# Patient Record
Sex: Male | Born: 1990 | Race: Black or African American | Hispanic: No | Marital: Single | Smoking: Current some day smoker
Health system: Southern US, Community
[De-identification: ages and names within clinical notes are randomized; demographics above are authoritative.]

## PROBLEM LIST (undated history)

## (undated) DIAGNOSIS — R569 Unspecified convulsions: Secondary | ICD-10-CM

---
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
## 2012-09-13 ENCOUNTER — Emergency Department: Payer: Self-pay | Admitting: Emergency Medicine

## 2012-09-13 LAB — URINALYSIS, COMPLETE
Bilirubin,UR: NEGATIVE
Blood: NEGATIVE
Glucose,UR: NEGATIVE mg/dL (ref 0–75)
Leukocyte Esterase: NEGATIVE
Nitrite: NEGATIVE
Protein: NEGATIVE
RBC,UR: 3 /HPF (ref 0–5)

## 2012-09-13 LAB — GC/CHLAMYDIA PROBE AMP

## 2012-09-20 ENCOUNTER — Emergency Department: Payer: Self-pay | Admitting: Emergency Medicine

## 2012-09-20 LAB — URINALYSIS, COMPLETE
Bilirubin,UR: NEGATIVE
Blood: NEGATIVE
Glucose,UR: NEGATIVE mg/dL (ref 0–75)
Ketone: NEGATIVE
Leukocyte Esterase: NEGATIVE
Ph: 7 (ref 4.5–8.0)
RBC,UR: <1 /HPF (ref 0–5)
Specific Gravity: 1.017 (ref 1.003–1.030)
Squamous Epithelial: <1
WBC UR: <1 /HPF (ref 0–5)

## 2018-10-11 ENCOUNTER — Encounter: Payer: Self-pay | Admitting: Emergency Medicine

## 2018-10-11 ENCOUNTER — Emergency Department
Admission: EM | Admit: 2018-10-11 | Discharge: 2018-10-12 | Disposition: A | Discharge status: Home / Self Care | Payer: BC Managed Care – PPO | Attending: Emergency Medicine | Admitting: Emergency Medicine

## 2018-10-11 DIAGNOSIS — F1210 Cannabis abuse, uncomplicated: Secondary | ICD-10-CM | POA: Insufficient documentation

## 2018-10-11 DIAGNOSIS — F17210 Nicotine dependence, cigarettes, uncomplicated: Secondary | ICD-10-CM | POA: Diagnosis not present

## 2018-10-11 DIAGNOSIS — R569 Unspecified convulsions: Secondary | ICD-10-CM | POA: Insufficient documentation

## 2018-10-11 HISTORY — DX: Unspecified convulsions: R56.9

## 2018-10-11 LAB — COMPREHENSIVE METABOLIC PANEL
ALT: 36 U/L (ref 0–44)
AST: 25 U/L (ref 15–41)
Albumin: 4.4 g/dL (ref 3.5–5.0)
Alkaline Phosphatase: 51 U/L (ref 38–126)
Anion gap: 10 (ref 5–15)
BUN: 14 mg/dL (ref 6–20)
CO2: 22 mmol/L (ref 22–32)
Calcium: 9.6 mg/dL (ref 8.9–10.3)
Chloride: 106 mmol/L (ref 98–111)
Creatinine, Ser: 0.89 mg/dL (ref 0.61–1.24)
GFR calc Af Amer: >60 mL/min (ref >60)
GFR calc non Af Amer: >60 mL/min (ref >60)
Glucose, Bld: 89 mg/dL (ref 70–99)
Potassium: 3.8 mmol/L (ref 3.5–5.1)
Sodium: 138 mmol/L (ref 135–145)
Total Bilirubin: 0.8 mg/dL (ref 0.3–1.2)
Total Protein: 7.3 g/dL (ref 6.5–8.1)

## 2018-10-11 LAB — CBC
HCT: 41.4 % (ref 39.0–52.0)
Hemoglobin: 13.5 g/dL (ref 13.0–17.0)
MCH: 25.3 pg — ABNORMAL LOW (ref 26.0–34.0)
MCHC: 32.6 g/dL (ref 30.0–36.0)
MCV: 77.5 fL — ABNORMAL LOW (ref 80.0–100.0)
Platelets: 245 10*3/uL (ref 150–400)
RBC: 5.34 MIL/uL (ref 4.22–5.81)
RDW: 13.4 % (ref 11.5–15.5)
WBC: 14.4 10*3/uL — ABNORMAL HIGH (ref 4.0–10.5)
nRBC: 0 % (ref 0.0–0.2)

## 2018-10-11 MED ORDER — LEVETIRACETAM IN NACL 1000 MG/100ML IV SOLN
1000.0000 mg | Freq: Once | INTRAVENOUS | Status: AC
  Administered 2018-10-11: 1000 mg via INTRAVENOUS
  Filled 2018-10-11: qty 100

## 2018-10-11 MED ORDER — LEVETIRACETAM 500 MG PO TABS: 500.0000 mg | ORAL_TABLET | Freq: Two times a day (BID) | ORAL | 2 refills | Status: DC

## 2018-10-11 NOTE — ED Notes (Signed)
Pt did not urinating on himself/no sings pt biting his tongue.

## 2018-10-11 NOTE — ED Notes (Signed)
ED Provider Paduchowski at bedside. 

## 2018-10-11 NOTE — ED Provider Notes (Signed)
[facility] Emergency Department Provider Note  Time seen: 10:15 PM  I have reviewed the triage vital signs and the nursing notes.   HISTORY  Chief Complaint Seizures  HPI Jose Cowan is a 28 y.o. male with a past medical history of 1 prior seizure presents to the emergency department for a likely seizure.  According to EMS report patient's girlfriend who is with the patient states he fell to the ground and had a seizure lasting approximately 1 minute.  Describes shaking activity and postictal period following the seizure activity.  Patient states in March she had a seizure for the first time, was worked up at [facility] with a negative work-up and ultimately discharged home.  Patient does admit to alcohol, but states only approximately 1 beer per day states he was drinking heavily previously but cut back over 8 months ago.  Does admit to marijuana use but denies any other substances.  Patient has not been followed up by a neurologist or PCP regarding the seizure.   Past Medical History:  Diagnosis Date  . Seizures (HCC)     There are no active problems to display for this patient.   History reviewed. No pertinent surgical history.  Prior to Admission medications   Not on File    Not on File  History reviewed. No pertinent family history.  Social History Social History   Tobacco Use  . Smoking status: Current Every Day Smoker    Packs/day: 1.00    Years: 10.00    Pack years: 10.00    Types: Cigarettes  . Smokeless tobacco: Never Used  Substance Use Topics  . Alcohol use: Yes  . Drug use: Yes    Frequency: 3.0 times per week    Types: Marijuana    Review of Systems Constitutional: Negative for fever. ENT: Negative for recent illness/congestion Cardiovascular: Negative for chest pain (contrary to triage note) Respiratory: Negative for shortness of breath.  Negative for cough. Gastrointestinal: Negative for abdominal pain, vomiting Genitourinary:  Negative for urinary compaints Musculoskeletal: Negative for musculoskeletal complaints Skin: Negative for skin complaints Neurological: Negative for headache All other ROS negative  ____________________________________________   PHYSICAL EXAM:  VITAL SIGNS: ED Triage Vitals  Enc Vitals Group     BP 10/11/18 2121 (!) 146/94     Pulse Rate 10/11/18 2119 85     Resp 10/11/18 2121 19     Temp 10/11/18 2119 98.5 F (36.9 C)     Temp Source 10/11/18 2119 Oral     SpO2 10/11/18 2119 97 %     Weight 10/11/18 2120 240 lb (108.9 kg)     Height 10/11/18 2120 6\' 2"  (1.88 m)     Head Circumference --      Peak Flow --      Pain Score 10/11/18 2120 0     Pain Loc --      Pain Edu? --      Excl. in GC? --    Constitutional: Alert and oriented. Well appearing and in no distress. Eyes: Normal exam ENT      Head: Normocephalic and atraumatic.      Mouth/Throat: Mucous membranes are moist. Cardiovascular: Normal rate, regular rhythm.  Respiratory: Normal respiratory effort without tachypnea nor retractions. Breath sounds are clear Gastrointestinal: Soft and nontender. No distention.   Musculoskeletal: Nontender with normal range of motion in all extremities. Neurologic:  Normal speech and language. No gross focal neurologic deficits  Skin:  Skin is warm, dry and  intact.  Psychiatric: Mood and affect are normal  ____________________________________________    EKG  EKG viewed and interpreted by myself shows a normal sinus rhythm at 85 bpm with a narrow QRS, normal axis, normal intervals, no concerning ST changes.  ____________________________________________   INITIAL IMPRESSION / ASSESSMENT AND PLAN / ED COURSE  Pertinent labs & imaging results that were available during my care of the patient were reviewed by me and considered in my medical decision making (see chart for details).   Patient presents to the emergency department after a possible seizure tonight approximately 1  minute of generalized tonic-clonic reported activity.  Patient had a seizure back in March as well.  I reviewed the patient's [facility] work-up at that time he had a negative CT scan as well as lab work showing no acute abnormalities.  Patient was ultimately discharged home but never followed up.  We will check lab work, urinalysis, loaded with IV Keppra.  Long the patient's work-up is normal the patient continues to appear well anticipate likely discharge home with Keppra and neurology follow-up.  Blood work is largely normal slight white blood cell count elevation could be explained by the seizure.  Patient is receiving his IV Keppra.  Urinalysis pending.  Patient care signed out to oncoming physician plan to discharge home with Keppra.  Jose Cowan was evaluated in Emergency Department on 10/11/2018 for the symptoms described in the history of present illness. He was evaluated in the context of the global COVID-19 pandemic, which necessitated consideration that the patient might be at risk for infection with the SARS-CoV-2 virus that causes COVID-19. Institutional protocols and algorithms that pertain to the evaluation of patients at risk for COVID-19 are in a state of rapid change based on information released by regulatory bodies including the CDC and federal and state organizations. These policies and algorithms were followed during the patient's care in the ED.  ____________________________________________   FINAL CLINICAL IMPRESSION(S) / ED DIAGNOSES  Seizure   Harvest Dark, MD 10/11/18 2322

## 2018-10-11 NOTE — Discharge Instructions (Signed)
Please take your medication as prescribed every day in.  Please do not drive until you have been cleared by neurology.  Please call the number provided for neurology to arrange a follow-up appointment as soon as possible.

## 2018-10-11 NOTE — ED Notes (Signed)
Report from yessica, rn.  

## 2018-10-11 NOTE — ED Triage Notes (Addendum)
Pt from home via [facility]. Per EMS, significant other reported pt falling on the hallway at home and witnessed a seizure that lasted for 43min. Pt unable to recall falling. Pt reports mid CP; SHOB at home, pt unable to recall falling n. Pt presents to ED with tremors.  NAD noted upon arrival.

## 2018-10-11 NOTE — ED Notes (Addendum)
Pt denies CP/SHOB/Dizziness/N/V at this time. Pt denies taking medications for seizures.

## 2018-10-12 NOTE — ED Provider Notes (Signed)
Patient appears alert and oriented at this time.  Will be discharged home with Keppra.   Earleen Newport, MD 10/12/18 [phone]

## 2018-10-12 NOTE — ED Notes (Signed)
Waiting on ride.

## 2019-12-17 ENCOUNTER — Emergency Department: Payer: BC Managed Care – PPO

## 2019-12-17 ENCOUNTER — Emergency Department
Admission: EM | Admit: 2019-12-17 | Discharge: 2019-12-17 | Disposition: A | Discharge status: Home / Self Care | Payer: BC Managed Care – PPO | Attending: Emergency Medicine | Admitting: Emergency Medicine

## 2019-12-17 ENCOUNTER — Other Ambulatory Visit: Payer: Self-pay

## 2019-12-17 DIAGNOSIS — Y92410 Unspecified street and highway as the place of occurrence of the external cause: Secondary | ICD-10-CM | POA: Diagnosis not present

## 2019-12-17 DIAGNOSIS — M5459 Other low back pain: Secondary | ICD-10-CM | POA: Insufficient documentation

## 2019-12-17 DIAGNOSIS — Y93I9 Activity, other involving external motion: Secondary | ICD-10-CM | POA: Diagnosis not present

## 2019-12-17 DIAGNOSIS — Z79899 Other long term (current) drug therapy: Secondary | ICD-10-CM | POA: Diagnosis not present

## 2019-12-17 DIAGNOSIS — M436 Torticollis: Secondary | ICD-10-CM | POA: Diagnosis not present

## 2019-12-17 DIAGNOSIS — F17210 Nicotine dependence, cigarettes, uncomplicated: Secondary | ICD-10-CM | POA: Diagnosis not present

## 2019-12-17 MED ORDER — MELOXICAM 15 MG PO TABS: 15.0000 mg | ORAL_TABLET | Freq: Every day | ORAL | 1 refills | Status: AC

## 2019-12-17 MED ORDER — METHOCARBAMOL 500 MG PO TABS: 500.0000 mg | ORAL_TABLET | Freq: Three times a day (TID) | ORAL | 0 refills | Status: AC | PRN
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
## 2019-12-17 NOTE — Discharge Instructions (Signed)
You can take meloxicam and Robaxin for pain and inflammation. 
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
## 2022-01-06 ENCOUNTER — Ambulatory Visit
Admission: RE | Admit: 2022-01-06 | Discharge: 2022-01-06 | Disposition: A | Discharge status: Home / Self Care | Payer: BC Managed Care – PPO | Source: Ambulatory Visit | Attending: Physician Assistant | Admitting: Physician Assistant

## 2022-01-06 VITALS — BP 124/100 | HR 80 | Temp 98.1°F | Resp 18 | Ht 75.0 in | Wt 253.0 lb

## 2022-01-06 DIAGNOSIS — Z202 Contact with and (suspected) exposure to infections with a predominantly sexual mode of transmission: Secondary | ICD-10-CM | POA: Diagnosis present

## 2022-01-06 DIAGNOSIS — R300 Dysuria: Secondary | ICD-10-CM

## 2022-01-06 LAB — URINALYSIS, ROUTINE W REFLEX MICROSCOPIC
Bilirubin Urine: NEGATIVE
Glucose, UA: NEGATIVE mg/dL
Hgb urine dipstick: NEGATIVE
Ketones, ur: NEGATIVE mg/dL
Leukocytes,Ua: NEGATIVE
Nitrite: NEGATIVE
Protein, ur: NEGATIVE mg/dL
Specific Gravity, Urine: 1.02 (ref 1.005–1.030)
pH: 6 (ref 5.0–8.0)

## 2022-01-06 MED ORDER — DOXYCYCLINE HYCLATE 100 MG PO CAPS: 100.0000 mg | ORAL_CAPSULE | Freq: Two times a day (BID) | ORAL | 0 refills | Status: AC

## 2022-01-06 NOTE — ED Provider Notes (Signed)
[facility]    CSN: [id number] Arrival date & time: 01/06/22  4401      History   Chief Complaint Chief Complaint  Patient presents with   Exposure to STD    HPI Jose Cowan is a 31 y.o. male presenting for possible STI.  Patient reports being sexually active with females a couple of weeks ago.  He says that she called him and told him that he needs to be tested for chlamydia because she has chlamydia.  He reports that in the past 3 days he has had some lower abdominal cramping, constipation, urinary urgency and penile discharge.  He says that he has had chlamydia in the past and these are symptoms like he had. He is afraid that he may have chlamydia.  HPI  Past Medical History:  Diagnosis Date   Seizures (HCC)     There are no problems to display for this patient.   History reviewed. No pertinent surgical history.     Home Medications    Prior to Admission medications   Medication Sig Start Date End Date Taking? Authorizing Provider  doxycycline (VIBRAMYCIN) 100 MG capsule Take 1 capsule (100 mg total) by mouth 2 (two) times daily for 7 days. 01/06/22 01/13/22 Yes Shirlee Latch, PA-C  levETIRAcetam (KEPPRA) 500 MG tablet Take 1 tablet (500 mg total) by mouth 2 (two) times daily. 10/11/18   Minna Antis, MD  valACYclovir (VALTREX) 500 MG tablet Take 500 mg by mouth daily. 07/08/18   [provider]    Family History History reviewed. No pertinent family history.  Social History Social History   Tobacco Use   Smoking status: Some Days    Packs/day: 0.50    Years: 10.00    Total pack years: 5.00    Types: Cigarettes   Smokeless tobacco: Never  Vaping Use   Vaping Use: Never used  Substance Use Topics   Alcohol use: Yes   Drug use: Yes    Frequency: 3.0 times per week    Types: Marijuana     Allergies   Patient has no known allergies.   Review of Systems Review of Systems  Constitutional:  Negative for fatigue and  fever.  Gastrointestinal:  Positive for abdominal pain and constipation. Negative for nausea and vomiting.  Genitourinary:  Positive for penile discharge and urgency. Negative for dysuria, frequency, genital sores, hematuria, penile pain, penile swelling, scrotal swelling and testicular pain.  Musculoskeletal:  Negative for arthralgias.  Skin:  Negative for rash.  Neurological:  Negative for weakness.     Physical Exam Triage Vital Signs ED Triage Vitals  Enc Vitals Group     BP 01/06/22 1001 (!) 124/100     Pulse Rate 01/06/22 1001 80     Resp 01/06/22 1001 18     Temp 01/06/22 1001 98.1 F (36.7 C)     Temp Source 01/06/22 1001 Oral     SpO2 01/06/22 1001 100 %     Weight 01/06/22 0959 253 lb (114.8 kg)     Height 01/06/22 0959 6\' 3"  (1.905 m)     Head Circumference --      Peak Flow --      Pain Score 01/06/22 0957 6     Pain Loc --      Pain Edu? --      Excl. in GC? --    No data found.  Updated Vital Signs BP (!) 124/100 (BP Location: Left Arm)   Pulse 80  Temp 98.1 F (36.7 C) (Oral)   Resp 18   Ht 6\' 3"  (1.905 m)   Wt 253 lb (114.8 kg)   SpO2 100%   BMI 31.62 kg/m     Physical Exam Vitals and nursing note reviewed.  Constitutional:      General: He is not in acute distress.    Appearance: Normal appearance. He is well-developed. He is not ill-appearing.  HENT:     Head: Normocephalic and atraumatic.  Eyes:     General: No scleral icterus.    Conjunctiva/sclera: Conjunctivae normal.  Cardiovascular:     Rate and Rhythm: Normal rate and regular rhythm.     Heart sounds: Normal heart sounds.  Pulmonary:     Effort: Pulmonary effort is normal. No respiratory distress.     Breath sounds: Normal breath sounds.  Abdominal:     Palpations: Abdomen is soft.     Tenderness: There is abdominal tenderness (generalized of lower abdomen).  Musculoskeletal:     Cervical back: Neck supple.  Skin:    General: Skin is warm and dry.     Capillary Refill:  Capillary refill takes less than 2 seconds.  Neurological:     General: No focal deficit present.     Mental Status: He is alert. Mental status is at baseline.     Motor: No weakness.     Gait: Gait normal.  Psychiatric:        Mood and Affect: Mood normal.        Behavior: Behavior normal.      UC Treatments / Results  Labs (all labs ordered are listed, but only abnormal results are displayed) Labs Reviewed  URINALYSIS, ROUTINE W REFLEX MICROSCOPIC  CYTOLOGY, (ORAL, ANAL, URETHRAL) ANCILLARY ONLY    EKG   Radiology No results found.  Procedures Procedures (including critical care time)  Medications Ordered in UC Medications - No data to display  Initial Impression / Assessment and Plan / UC Course  I have reviewed the triage vital signs and the nursing notes.  Pertinent labs & imaging results that were available during my care of the patient were reviewed by me and considered in my medical decision making (see chart for details).   31 year old male presents for exposure to chlamydia and urinary urgency, penile discharge and lower abdominal cramping with constipation for the past few days.  No other symptoms.  History of chlamydia.  He is overall well-appearing.  Abdomen is soft and has tenderness to palpation of the lower abdomen.  No CVA tenderness.  Chest clear to auscultation.   GU exam deferred.  Patient wants to perform a self swab.  Urinalysis performed is normal.  Pending GC/chlamydia testing.  We will treat him for chlamydia at this time.  Sent doxycycline to pharmacy.  Advised of safe sex.  Reviewed guidelines if he does have an STI.  Return precautions discussed.   Final Clinical Impressions(s) / UC Diagnoses   Final diagnoses:  Dysuria  STD exposure     Discharge Instructions      -You do not have a UTI.  -Since you have been exposed to chlamydia and do have symptoms, I sent antibiotics to pharmacy while we wait for your test results.  Those  results should be back in the next 24 hours.  We will call you with any positives and also may contact you if the test results are negative advised to discontinue antibiotics.  Results will be available on MyChart. - Practice safe sex. -  If you do have chlamydia you will need to make sure to abstain from sexual intercourse until 1 week after you and your partner have both been treated.   ED Prescriptions     Medication Sig Dispense Auth. Provider   doxycycline (VIBRAMYCIN) 100 MG capsule Take 1 capsule (100 mg total) by mouth 2 (two) times daily for 7 days. 14 capsule Danton Clap, PA-C      PDMP not reviewed this encounter.   Danton Clap, PA-C 01/06/22 1043

## 2022-01-06 NOTE — ED Triage Notes (Signed)
Pt c/o possible STD.  Pt was told by a partner that he needs to be tested for Chlamydia.   Pt last had unprotected sex 2 weeks ago and was told to be tested for chlamydia 3 days ago.   Pt denies any discharge, but states that he has burning and pressure along his upper abdomen with urinary urgency.

## 2022-01-06 NOTE — Discharge Instructions (Addendum)
-  You do not have a UTI.  -Since you have been exposed to chlamydia and do have symptoms, I sent antibiotics to pharmacy while we wait for your test results.  Those results should be back in the next 24 hours.  We will call you with any positives and also may contact you if the test results are negative advised to discontinue antibiotics.  Results will be available on MyChart. - Practice safe sex. - If you do have chlamydia you will need to make sure to abstain from sexual intercourse until 1 week after you and your partner have both been treated.

## 2022-01-09 LAB — CYTOLOGY, (ORAL, ANAL, URETHRAL) ANCILLARY ONLY
Chlamydia: NEGATIVE
Comment: NEGATIVE
Comment: NEGATIVE
Comment: NORMAL
Neisseria Gonorrhea: NEGATIVE
Trichomonas: NEGATIVE
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
## 2022-11-23 ENCOUNTER — Ambulatory Visit (INDEPENDENT_AMBULATORY_CARE_PROVIDER_SITE_OTHER): Payer: BC Managed Care – PPO

## 2022-11-23 ENCOUNTER — Ambulatory Visit
Admission: EM | Admit: 2022-11-23 | Discharge: 2022-11-23 | Disposition: A | Discharge status: Home / Self Care | Payer: BC Managed Care – PPO | Attending: Family Medicine | Admitting: Family Medicine

## 2022-11-23 DIAGNOSIS — M79672 Pain in left foot: Secondary | ICD-10-CM

## 2022-11-23 DIAGNOSIS — M79675 Pain in left toe(s): Secondary | ICD-10-CM

## 2022-11-23 DIAGNOSIS — S92425A Nondisplaced fracture of distal phalanx of left great toe, initial encounter for closed fracture: Secondary | ICD-10-CM | POA: Diagnosis not present

## 2022-11-23 MED ORDER — OXYCODONE-ACETAMINOPHEN 5-325 MG PO TABS: 1.0000 | ORAL_TABLET | Freq: Four times a day (QID) | ORAL | 0 refills | Status: DC | PRN

## 2022-11-23 NOTE — ED Provider Notes (Addendum)
[facility]    CSN: [id number] Arrival date & time: 11/23/22  1734      History   Chief Complaint Chief Complaint  Patient presents with   Toe Injury    HPI Jose Cowan is a 32 y.o. male.   HPI  History obtained from the patient. My presents for fall down 5 steps after getting off work this morning.  He scraped his right shin and left ankle during the fall.  He notes that he hit his big toe.  He has bruising over the big toe now.  He is unsure what happened but work up this morning in a lot of pain.  Moving his feet under the covers was bothering his foot.  Nothing taken for pain prior to arrival.  No ice.         Past Medical History:  Diagnosis Date   Seizures (HCC)     There are no problems to display for this patient.   History reviewed. No pertinent surgical history.     Home Medications    Prior to Admission medications   Medication Sig Start Date End Date Taking? Authorizing Provider  levETIRAcetam (KEPPRA) 500 MG tablet Take 1 tablet (500 mg total) by mouth 2 (two) times daily. 10/11/18   Minna Antis, MD  oxyCODONE-acetaminophen (PERCOCET/ROXICET) 5-325 MG tablet Take 1 tablet by mouth every 6 (six) hours as needed for severe pain. 11/23/22   Kaelan Amble, Seward Meth, DO  valACYclovir (VALTREX) 500 MG tablet Take 500 mg by mouth daily. 07/08/18   [provider]    Family History History reviewed. No pertinent family history.  Social History Social History   Tobacco Use   Smoking status: Some Days    Current packs/day: 0.50    Average packs/day: 0.5 packs/day for 10.0 years (5.0 ttl pk-yrs)    Types: Cigarettes   Smokeless tobacco: Never  Vaping Use   Vaping status: Never Used  Substance Use Topics   Alcohol use: Yes   Drug use: Yes    Frequency: 3.0 times per week    Types: Marijuana     Allergies   Patient has no known allergies.   Review of Systems Review of Systems: negative unless otherwise stated in  HPI.      Physical Exam Triage Vital Signs ED Triage Vitals  Encounter Vitals Group     BP 11/23/22 1752 124/84     Systolic BP Percentile --      Diastolic BP Percentile --      Pulse Rate 11/23/22 1752 91     Resp --      Temp 11/23/22 1752 98.6 F (37 C)     Temp Source 11/23/22 1752 Oral     SpO2 11/23/22 1752 94 %     Weight 11/23/22 1750 250 lb (113.4 kg)     Height 11/23/22 1750 6\' 3"  (1.905 m)     Head Circumference --      Peak Flow --      Pain Score 11/23/22 1750 9     Pain Loc --      Pain Education --      Exclude from Growth Chart --    No data found.  Updated Vital Signs BP 124/84 (BP Location: Left Arm)   Pulse 91   Temp 98.6 F (37 C) (Oral)   Ht 6\' 3"  (1.905 m)   Wt 113.4 kg   SpO2 94%   BMI 31.25 kg/m   Visual Acuity Right Eye  Distance:   Left Eye Distance:   Bilateral Distance:    Right Eye Near:   Left Eye Near:    Bilateral Near:     Physical Exam GEN:     alert, well appearing male in no distress    HENT:  mucus membranes moist, no nasal discharge\ EYES:   pupils equal and reactive NECK:  normal ROM   RESP:  no increased work of breathing CVS:   regular rate, strong DP pulses MSK; Ankle/Foot, left: TTP noted at the distal great toe with mild edema and ecchymosis but no erythema or bony deformity. Transverse arch grossly intact;  No evidence of tibiotalar deviation; ankle range of motion is full in all directions. Strength is 5/5 in all directions. No tenderness at the insertion/body/myotendinous junction of the Achilles tendon; No tenderness on posterior aspects of lateral and medial malleolus; Unremarkable squeeze; Talar dome non-tender; Unremarkable calcaneal squeeze; No plantar calcaneal tenderness; No tenderness over the navicular prominence or  over cuboid; No pain at base of 5th MT; No tenderness at the distal 2nd through 5th metatarsals; Able to walk 4 steps but with pain Skin:   warm and dry, linear abrasion on right  shin    UC Treatments / Results  Labs (all labs ordered are listed, but only abnormal results are displayed) Labs Reviewed - No data to display  EKG   Radiology DG Foot Complete Left  Result Date: 11/23/2022 CLINICAL DATA:  Status post trauma. EXAM: LEFT FOOT - COMPLETE 3+ VIEW COMPARISON:  None Available. FINDINGS: There is an acute, nondisplaced fracture defect extending through the base of the distal phalanx of the left great toe. There is no evidence of dislocation. An ill-defined, 10 mm diameter lytic lesion is suspected within the anterior aspect of the distal left fibula. This is seen on the lateral view. Mild soft tissue swelling is seen surrounding the previously noted fracture site. IMPRESSION: Acute fracture of the distal phalanx of the left great toe. Electronically Signed   By: Aram Candela M.D.   On: 11/23/2022 19:38     Procedures Procedures (including critical care time)  Medications Ordered in UC Medications - No data to display  Initial Impression / Assessment and Plan / UC Course  I have reviewed the triage vital signs and the nursing notes.  Pertinent labs & imaging results that were available during my care of the patient were reviewed by me and considered in my medical decision making (see chart for details).       Pt is a 32 y.o. male who presents after a fall this morning after getting off work.  On exam, pt has tenderness and ecchymosis at distal left great toe  concerning for fracture.   Obtained left foot plain films.  Personally interpreted by me were remarkable for left great toe distal phalanx fracture . Radiologist report reviewed and additionally notes mild soft tissue swelling and distal phalanx nondisplaced fracture with 10 mm lytic lesion in the anterior distal fibula.  He was placed in a hard soled boot prior to discharge.  Crutches declined.  Patient to gradually return to normal activities, as tolerated and continue ordinary activities  within the limits permitted by pain. Prescribed Percocet for acute fracture pain.  Motrin and Tylenol PRN.   Patient to follow up with orthopedic provide for definitive treatment.  Return and ED precautions given. Understanding voiced. Discussed MDM, treatment plan and plan for follow-up with patient who agrees with plan.  Final Clinical Impressions(s) / UC Diagnoses   Final diagnoses:  Closed nondisplaced fracture of distal phalanx of left great toe, initial encounter  Left foot pain     Discharge Instructions      You broke your big toe as shown on your x-ray today.  Stop by the pharmacy to pick up your Percocet.  Do not drive or operate heavy machinery while taking this medication.  For your  pain, Take 1000 mg Tylenol twice a day.  Rest and elevate the affected painful area.  Apply cold compresses intermittently, as needed.  As pain recedes, begin normal activities slowly as tolerated.  Follow up with primary care provider or an orthopedic provider, if symptoms persist.  Watch for worsening symptoms such as an increasing weakness or loss of sensation, increasing pain and/or the loss of bladder or bowel function. Should any of these occur, go to the emergency department immediately.   Follow-up with [facility] for further evaluation of your broken bone.      ED Prescriptions     Medication Sig Dispense Auth. Provider   oxyCODONE-acetaminophen (PERCOCET/ROXICET) 5-325 MG tablet Take 1 tablet by mouth every 6 (six) hours as needed for severe pain. 12 tablet Josef Tourigny, DO      I have reviewed the PDMP during this encounter.       Katha Cabal, DO 11/26/22 0136

## 2022-11-23 NOTE — Discharge Instructions (Addendum)
You broke your big toe as shown on your x-ray today.  Stop by the pharmacy to pick up your Percocet.  Do not drive or operate heavy machinery while taking this medication.  For your  pain, Take 1000 mg Tylenol twice a day.  Rest and elevate the affected painful area.  Apply cold compresses intermittently, as needed.  As pain recedes, begin normal activities slowly as tolerated.  Follow up with primary care provider or an orthopedic provider, if symptoms persist.  Watch for worsening symptoms such as an increasing weakness or loss of sensation, increasing pain and/or the loss of bladder or bowel function. Should any of these occur, go to the emergency department immediately.   Follow-up with [facility] for further evaluation of your broken bone.

## 2022-11-23 NOTE — ED Triage Notes (Signed)
PT c/o left foot injury  Pt was wearing flipflops down the steps and tripped. Pt fell down 5 Steps and hit his left big toe, scrapped his lateral left ankle, and bruised his right shin.   Pt right shin feels "lumpy" and has a red line down the front of the leg.   Pt left 1st toe has bruising along the medial side and along the base of the toe nail.   Pt states that he can not bend his toes and feels pain when he points his foot. Pt denies pain in his knee.   Pt states that his toe is 5/10 when still and 9/10 when moving.

## 2023-01-08 ENCOUNTER — Encounter: Payer: Self-pay | Admitting: Emergency Medicine

## 2023-01-08 ENCOUNTER — Ambulatory Visit
Admission: EM | Admit: 2023-01-08 | Discharge: 2023-01-08 | Disposition: A | Discharge status: Home / Self Care | Payer: BC Managed Care – PPO | Attending: Family Medicine | Admitting: Family Medicine

## 2023-01-08 DIAGNOSIS — R030 Elevated blood-pressure reading, without diagnosis of hypertension: Secondary | ICD-10-CM | POA: Diagnosis not present

## 2023-01-08 DIAGNOSIS — K0889 Other specified disorders of teeth and supporting structures: Secondary | ICD-10-CM | POA: Diagnosis not present

## 2023-01-08 MED ORDER — AMOXICILLIN-POT CLAVULANATE 875-125 MG PO TABS: 1.0000 | ORAL_TABLET | Freq: Two times a day (BID) | ORAL | 0 refills | Status: DC

## 2023-01-08 MED ORDER — NAPROXEN 500 MG PO TABS: 500.0000 mg | ORAL_TABLET | Freq: Two times a day (BID) | ORAL | 0 refills | Status: DC

## 2023-01-08 NOTE — Discharge Instructions (Signed)
Your sore throat is not related to strep.  I suspect this may be viral.  Gargle with warm salt water several times a day and take ibuprofen for discomfort.  Drinking warm liquids can help with pain as well.  You can purchase some over-the-counter Chloraseptic spray and throat lozenges to help with discomfort.  At this time there is no abscess to be drained. You were prescribed an antibiotic. Please take this exactly as directed and do not stop taking it until the entire course of medicine is finished, even if you begin to feel better before finishing the course.  Schedule an appointment with your dentist or call local dentists to see if they take your insurance or can do a payment payment plan.  [facility] [employer] [facility] are options. Consider the [facility] dental school  free clinic operates from 6-9 pm on select Wednesdays:  [facility] [employer], [place] [address] [place], [place] [address] Parking Location: [place] [place]

## 2023-01-08 NOTE — ED Triage Notes (Signed)
Pt presents with left side dental pain and headache x 3 days. The pain became worse yesterday. Pt states he had a tooth extracted on the left side a few weeks ago. Pt has taken tylenol for the pain.

## 2023-01-08 NOTE — ED Provider Notes (Signed)
[facility]    CSN: [id number] Arrival date & time: 01/08/23  6295      History   Chief Complaint Chief Complaint  Patient presents with   Headache   Dental Pain    HPI Hubbert Landrigan is a 32 y.o. male.   HPI  Caetano presents for headache and lower left lower dental pain for the past 3 days.  Pain got acutely worse yesterday.  A few weeks ago, he had a tooth removed on that side and believes he may have a dried socket.  He has not reached back out to his dental care provider.  He smokes cigarettes.  Has been taking Tylenol without relief.    Past Medical History:  Diagnosis Date   Seizures (HCC)     There are no problems to display for this patient.   History reviewed. No pertinent surgical history.     Home Medications    Prior to Admission medications   Medication Sig Start Date End Date Taking? Authorizing Provider  amoxicillin-clavulanate (AUGMENTIN) 875-125 MG tablet Take 1 tablet by mouth every 12 (twelve) hours. 01/08/23  Yes Caddie Randle, DO  naproxen (NAPROSYN) 500 MG tablet Take 1 tablet (500 mg total) by mouth 2 (two) times daily with a meal. 01/08/23  Yes Santana Edell, DO  levETIRAcetam (KEPPRA) 500 MG tablet Take 1 tablet (500 mg total) by mouth 2 (two) times daily. 10/11/18   Minna Antis, MD  valACYclovir (VALTREX) 500 MG tablet Take 500 mg by mouth daily. 07/08/18   [provider]    Family History History reviewed. No pertinent family history.  Social History Social History   Tobacco Use   Smoking status: Some Days    Current packs/day: 0.50    Average packs/day: 0.5 packs/day for 10.0 years (5.0 ttl pk-yrs)    Types: Cigarettes   Smokeless tobacco: Never  Vaping Use   Vaping status: Never Used  Substance Use Topics   Alcohol use: Yes   Drug use: Yes    Frequency: 3.0 times per week    Types: Marijuana     Allergies   Patient has no known allergies.   Review of Systems Review of Systems :  negative unless otherwise stated in HPI.      Physical Exam Triage Vital Signs ED Triage Vitals  Encounter Vitals Group     BP 01/08/23 1932 (!) 152/99     Systolic BP Percentile --      Diastolic BP Percentile --      Pulse Rate 01/08/23 1932 72     Resp 01/08/23 1932 18     Temp 01/08/23 1932 99.1 F (37.3 C)     Temp Source 01/08/23 1932 Oral     SpO2 01/08/23 1932 98 %     Weight --      Height --      Head Circumference --      Peak Flow --      Pain Score 01/08/23 1931 10     Pain Loc --      Pain Education --      Exclude from Growth Chart --    No data found.  Updated Vital Signs BP (!) 152/99 (BP Location: Right Arm)   Pulse 72   Temp 99.1 F (37.3 C) (Oral)   Resp 18   SpO2 98%   Visual Acuity Right Eye Distance:   Left Eye Distance:   Bilateral Distance:    Right Eye Near:  Left Eye Near:    Bilateral Near:     Physical Exam  GEN:     alert, uncomfortable appearing male in no distress    HENT:  mucus membranes moist, oropharyngeal without lesions exudates or erythema, nasal discharge, anterior left tender to percussion, no trismus, no secretion pooling, no palpable induration and no visible swelling of the floor the mouth, no areas of fluctuance, drainage, or bleeding, normal jaw movement without difficulty EYES:   pupils equal and reactive, no scleral injection or discharge RESP:  no increased work of breathing, clear bilaterally  CVS:   regular rate and rhythm Skin:   warm and dry, no rash or skin changes of on external jaw    UC Treatments / Results  Labs (all labs ordered are listed, but only abnormal results are displayed) Labs Reviewed - No data to display  EKG   Radiology No results found.  Procedures Procedures (including critical care time)  Medications Ordered in UC Medications - No data to display  Initial Impression / Assessment and Plan / UC Course  I have reviewed the triage vital signs and the nursing  notes.  Pertinent labs & imaging results that were available during my care of the patient were reviewed by me and considered in my medical decision making (see chart for details).     Pt is a 32 y.o. male who presents for 3 days of dental pain. [place] is afebrile here without recent antipyretics. Satting well on room air. Overall pt is well appearing, well hydrated, without respiratory distress.  Dental exam concerning for developing dental abscess.  Prescribed Naprosyn for pain and Augmentin for infection. - continue Tylenol as needed  - Gargle with salt water several times a day -  Given information for [facility] [employer] [facility] who has been known to provide care plans and low-cost visits   He is hypertensive today BP 152/99.  On chart review, he has not had elevated blood pressures like this in over a year.  This could be pain related.  Advised him to check his blood pressure daily for the next 3 weeks and if remains elevated 135/85 he should see his primary care doctor.  Discussed  ED precautions, understanding voiced. Discussed MDM, treatment plan and plan for follow-up with patient who agrees with plan.   Final Clinical Impressions(s) / UC Diagnoses   Final diagnoses:  Pain, dental  Elevated blood pressure reading without diagnosis of hypertension     Discharge Instructions      At this time there is no abscess to be drained. You were prescribed an antibiotic. Please take this exactly as directed and do not stop taking it until the entire course of medicine is finished, even if you begin to feel better before finishing the course.   Schedule an appointment with your dentist or call local dentists to see if they take your insurance or can do a payment payment plan.  [facility] [employer] [facility] are options.   Consider the [facility] dental school  free clinic operates from 6-9 pm on select Wednesdays:  [facility] [employer], [place] [address] [place], [place] [address] Parking Location: [place] [place]            ED Prescriptions     Medication Sig Dispense Auth. Provider   amoxicillin-clavulanate (AUGMENTIN) 875-125 MG tablet Take 1 tablet by mouth every 12 (twelve) hours. 14 tablet Katha Cabal,  DO   naproxen (NAPROSYN) 500 MG tablet Take 1 tablet (500 mg total) by mouth 2 (two) times daily with a meal. 20 tablet [employer], DO      PDMP not reviewed this encounter.   Katha Cabal, DO 01/12/23 1144

## 2023-06-13 ENCOUNTER — Ambulatory Visit
Admission: EM | Admit: 2023-06-13 | Discharge: 2023-06-13 | Disposition: A | Discharge status: Home / Self Care | Payer: Self-pay | Attending: Family Medicine | Admitting: Family Medicine

## 2023-06-13 DIAGNOSIS — B349 Viral infection, unspecified: Secondary | ICD-10-CM

## 2023-06-13 DIAGNOSIS — J302 Other seasonal allergic rhinitis: Secondary | ICD-10-CM

## 2023-06-13 MED ORDER — CETIRIZINE HCL 10 MG PO TABS: 10.0000 mg | ORAL_TABLET | Freq: Every day | ORAL | 0 refills | Status: DC | PRN

## 2023-06-13 NOTE — ED Triage Notes (Signed)
 Pt c/o dizziness & emesis x1 day. Had 3 episodes of emesis yesterday. States able to keep foods & fluids down. Denies any abd pain or diarrhea.

## 2023-06-13 NOTE — Discharge Instructions (Addendum)
 Stop by the pharmacy to pick up your prescription.  Follow up with your primary care provider or return to the urgent care, if not improving.

## 2023-06-13 NOTE — ED Provider Notes (Signed)
 [facility]    CSN: [id number] Arrival date & time: 06/13/23  1138      History   Chief Complaint Chief Complaint  Patient presents with   Dizziness   Emesis    HPI Bartlett Enke is a 33 y.o. male.   HPI   Ryot presents for nausea, abdominal pain, vomiting, dry throat and mouth that started yesterday.  Has a scratchy throat on Monday. Has some post nasal drip. He tends to get sick this time a year. Thinks it may be allergy related. Took some OTC medication but it didn't help.   Dizziness and vomiting have resolved. Vomited only after sniffing up the nasal drainage. Has some slight lower abdominal pain persists.  Has some diarrhea. Denies fever. Endorses shortness of breath, cough, rhinorrhea.        Past Medical History:  Diagnosis Date   Seizures (HCC)     There are no active problems to display for this patient.   History reviewed. No pertinent surgical history.     Home Medications    Prior to Admission medications   Medication Sig Start Date End Date Taking? Authorizing Provider  valACYclovir (VALTREX) 500 MG tablet Take 500 mg by mouth daily. 07/08/18  Yes [provider]  amoxicillin-clavulanate (AUGMENTIN) 875-125 MG tablet Take 1 tablet by mouth every 12 (twelve) hours. 01/08/23   Mihira Tozzi, Seward Meth, DO  levETIRAcetam (KEPPRA) 500 MG tablet Take 1 tablet (500 mg total) by mouth 2 (two) times daily. 10/11/18   Minna Antis, MD  naproxen (NAPROSYN) 500 MG tablet Take 1 tablet (500 mg total) by mouth 2 (two) times daily with a meal. 01/08/23   Katha Cabal, DO    Family History History reviewed. No pertinent family history.  Social History Social History   Tobacco Use   Smoking status: Some Days    Current packs/day: 0.50    Average packs/day: 0.5 packs/day for 10.0 years (5.0 ttl pk-yrs)    Types: Cigarettes   Smokeless tobacco: Never  Vaping Use   Vaping status: Never Used  Substance Use Topics   Alcohol use: Yes    Drug use: Yes    Frequency: 3.0 times per week    Types: Marijuana     Allergies   Patient has no known allergies.   Review of Systems Review of Systems: negative unless otherwise stated in HPI.      Physical Exam Triage Vital Signs ED Triage Vitals  Encounter Vitals Group     BP 06/13/23 1144 123/85     Systolic BP Percentile --      Diastolic BP Percentile --      Pulse Rate 06/13/23 1144 99     Resp 06/13/23 1144 16     Temp 06/13/23 1144 98.9 F (37.2 C)     Temp Source 06/13/23 1144 Oral     SpO2 06/13/23 1144 97 %     Weight 06/13/23 1143 260 lb (117.9 kg)     Height 06/13/23 1143 6\' 3"  (1.905 m)     Head Circumference --      Peak Flow --      Pain Score 06/13/23 1147 0     Pain Loc --      Pain Education --      Exclude from Growth Chart --    No data found.  Updated Vital Signs BP 123/85 (BP Location: Right Arm)   Pulse 99   Temp 98.9 F (37.2 C) (Oral)   Resp 16  Ht 6\' 3"  (1.905 m)   Wt 117.9 kg   SpO2 97%   BMI 32.50 kg/m   Visual Acuity Right Eye Distance:   Left Eye Distance:   Bilateral Distance:    Right Eye Near:   Left Eye Near:    Bilateral Near:     Physical Exam GEN:     alert, cooperative and no distress ***   HENT:  mucus membranes moist, oropharyngeal without lesions or erythema ***,  nares patent, no nasal discharge *** EYES:   pupils equal and reactive, EOM intact NECK:  supple, normal ROM, no lymphadenopathy *** RESP:  clear to auscultation bilaterally, no increased work of breathing *** CVS:   regular rate and rhythm, no murmur, distal pulses intact  *** ABD:  soft, non-tender; bowel sounds present; no palpable masses,  *** EXT:   normal ROM, atraumatic, no edema *** NEURO:  normal without focal findings,  speech normal, alert and oriented  *** Skin:   warm and dry, no rash***, normal*** skin turgor Psych: Normal affect, appropriate speech and behavior      UC Treatments / Results  Labs (all labs ordered are  listed, but only abnormal results are displayed) Labs Reviewed - No data to display  EKG  If EKG performed, see my interpretation in the MDM section  Radiology No results found.   Procedures Procedures (including critical care time)  Medications Ordered in UC Medications - No data to display  Initial Impression / Assessment and Plan / UC Course  I have reviewed the triage vital signs and the nursing notes.  Pertinent labs & imaging results that were available during my care of the patient were reviewed by me and considered in my medical decision making (see chart for details).       Patient is a 33 y.o. male  who presents for ***.  Overall patient is nontoxic-appearing and ***afebrile.  Vital signs stable.   ED and return precautions given and patient/guardian voiced understanding. Discussed MDM, treatment plan and plan for follow-up with patient*** who agrees with plan.     Discussed MDM, treatment plan and plan for follow-up with patient***who agrees with plan.   Final Clinical Impressions(s) / UC Diagnoses   Final diagnoses:  None   Discharge Instructions   None    ED Prescriptions   None    PDMP not reviewed this encounter.

## 2023-07-11 ENCOUNTER — Ambulatory Visit
Admission: EM | Admit: 2023-07-11 | Discharge: 2023-07-11 | Disposition: A | Discharge status: Home / Self Care | Payer: Self-pay | Attending: Family Medicine | Admitting: Family Medicine

## 2023-07-11 DIAGNOSIS — R300 Dysuria: Secondary | ICD-10-CM | POA: Insufficient documentation

## 2023-07-11 DIAGNOSIS — R369 Urethral discharge, unspecified: Secondary | ICD-10-CM | POA: Insufficient documentation

## 2023-07-11 DIAGNOSIS — Z711 Person with feared health complaint in whom no diagnosis is made: Secondary | ICD-10-CM | POA: Insufficient documentation

## 2023-07-11 LAB — URINALYSIS, W/ REFLEX TO CULTURE (INFECTION SUSPECTED)
Bilirubin Urine: NEGATIVE
Glucose, UA: NEGATIVE mg/dL
Ketones, ur: NEGATIVE mg/dL
Nitrite: NEGATIVE
Protein, ur: 100 mg/dL — AB
Specific Gravity, Urine: 1.02 (ref 1.005–1.030)
WBC, UA: >50 WBC/hpf (ref 0–5)
pH: 8.5 — ABNORMAL HIGH (ref 5.0–8.0)

## 2023-07-11 LAB — HIV ANTIBODY (ROUTINE TESTING W REFLEX): HIV Screen 4th Generation wRfx: NONREACTIVE

## 2023-07-11 MED ORDER — CEFTRIAXONE SODIUM 500 MG IJ SOLR
500.0000 mg | Freq: Once | INTRAMUSCULAR | Status: AC
  Administered 2023-07-11: 500 mg via INTRAMUSCULAR

## 2023-07-11 MED ORDER — AZITHROMYCIN 500 MG PO TABS
1000.0000 mg | ORAL_TABLET | Freq: Once | ORAL | Status: AC
  Administered 2023-07-11: 1000 mg via ORAL

## 2023-07-11 NOTE — ED Provider Notes (Signed)
 [facility]    CSN: [id number] Arrival date & time: 07/11/23  5366      History   Chief Complaint Chief Complaint  Patient presents with   Exposure to STD    HPI  HPI  Jose Cowan is a 33 y.o. male for penile discharge, dysuria, hematuria.  Hematuria started today and the other symptoms started 4 days ago. No history of kidney stones. No known exposure. He has not been trusting his partner.    - Penile discharge: yellow -Testicular pain: no  - Fever: no - Abdominal pain: lower abdominal  - Rash: no - Nausea: no - Vomiting: no - Dysuria: yes  - Back Pain: yes        Past Medical History:  Diagnosis Date   Seizures (HCC)     There are no active problems to display for this patient.   History reviewed. No pertinent surgical history.     Home Medications    Prior to Admission medications   Medication Sig Start Date End Date Taking? Authorizing Provider  cetirizine  (ZYRTEC ) 10 MG tablet Take 1 tablet (10 mg total) by mouth daily as needed for allergies. 06/13/23   Najat Olazabal, DO  levETIRAcetam  (KEPPRA ) 500 MG tablet Take 1 tablet (500 mg total) by mouth 2 (two) times daily. 10/11/18   Ruth Cove, MD  valACYclovir (VALTREX) 500 MG tablet Take 500 mg by mouth daily. 07/08/18   [provider]    Family History History reviewed. No pertinent family history.  Social History Social History   Tobacco Use   Smoking status: Some Days    Current packs/day: 0.50    Average packs/day: 0.5 packs/day for 10.0 years (5.0 ttl pk-yrs)    Types: Cigarettes   Smokeless tobacco: Never  Vaping Use   Vaping status: Never Used  Substance Use Topics   Alcohol use: Yes   Drug use: Yes    Frequency: 3.0 times per week    Types: Marijuana     Allergies   Patient has no known allergies.   Review of Systems Review of Systems: negative unless otherwise stated in HPI.      Physical Exam Triage Vital Signs ED Triage Vitals   Encounter Vitals Group     BP 07/11/23 1109 131/84     Systolic BP Percentile --      Diastolic BP Percentile --      Pulse Rate 07/11/23 1109 75     Resp 07/11/23 1109 16     Temp 07/11/23 1109 97.9 F (36.6 C)     Temp Source 07/11/23 1109 Oral     SpO2 07/11/23 1109 97 %     Weight --      Height --      Head Circumference --      Peak Flow --      Pain Score 07/11/23 1110 5     Pain Loc --      Pain Education --      Exclude from Growth Chart --    No data found.  Updated Vital Signs BP 131/84 (BP Location: Right Arm)   Pulse 75   Temp 97.9 F (36.6 C) (Oral)   Resp 16   SpO2 97%   Visual Acuity Right Eye Distance:   Left Eye Distance:   Bilateral Distance:    Right Eye Near:   Left Eye Near:    Bilateral Near:     Physical Exam GEN: well appearing male in no  acute distress  CVS: well perfused  RESP: speaking in full sentences without pause, no respiratory distress  GU: deferred, patient performed self swab    UC Treatments / Results  Labs (all labs ordered are listed, but only abnormal results are displayed) Labs Reviewed  URINALYSIS, W/ REFLEX TO CULTURE (INFECTION SUSPECTED) - Abnormal; Notable for the following components:      Result Value   APPearance HAZY (*)    pH 8.5 (*)    Hgb urine dipstick TRACE (*)    Protein, ur 100 (*)    Leukocytes,Ua MODERATE (*)    Bacteria, UA FEW (*)    All other components within normal limits  URINE CULTURE  RPR  HIV ANTIBODY (ROUTINE TESTING W REFLEX)  CYTOLOGY, (ORAL, ANAL, URETHRAL) ANCILLARY ONLY    EKG   Radiology No results found.  Procedures Procedures (including critical care time)  Medications Ordered in UC Medications  cefTRIAXone (ROCEPHIN) injection 500 mg (500 mg Intramuscular Given 07/11/23 1154)  azithromycin (ZITHROMAX) tablet 1,000 mg (1,000 mg Oral Given 07/11/23 1154)    Initial Impression / Assessment and Plan / UC Course  I have reviewed the triage vital signs and the  nursing notes.  Pertinent labs & imaging results that were available during my care of the patient were reviewed by me and considered in my medical decision making (see chart for details).     Patient is a 33 y.o.. male who presents for STD testing.  Overall patient is well-appearing and afebrile.  Vital signs stable.  Patient experiencing penile discharge, hematuria and dysuria.  Urinalysis showing moderate leukocyte esterase but very few bacteria.  This may be secondary to an STI.  Urine culture obtained.  Start antibiotics, if needed. There is no hematuria to suggest a kidney stone.  Obtained gonorrhea, chlamydia and trichomonas swab.  Recommended HIV and syphilis and he is agreeable.  Discussed prophylactically treating and he is agreeable to this.  Ceftriaxone 500 mg IM and azithromycin 1000 mg given.  Advised him to use condoms with every sexual encounter.   Discussed MDM, treatment plan and plan for follow-up with patient who agrees with plan.     Final Clinical Impressions(s) / UC Diagnoses   Final diagnoses:  Concern about STD in male without diagnosis  Penile discharge  Dysuria     Discharge Instructions      You were treated for both gonorrhea and chlamydia here.    Your STD test results will be available in the next 72 hours. If positive, someone will contact you.  You should see your results in your MyChart account.       ED Prescriptions   None    PDMP not reviewed this encounter.   Fidel Huddle, DO 07/11/23 1247

## 2023-07-11 NOTE — ED Triage Notes (Signed)
 Yellow penile discharge, burning with urination x 3 days. Pt states he is having blood in urine and lower abdominal cramping and lower back pain x 1 day.

## 2023-07-11 NOTE — Discharge Instructions (Addendum)
 You were treated for both gonorrhea and chlamydia here.    Your STD test results will be available in the next 72 hours. If positive, someone will contact you.  You should see your results in your MyChart account.

## 2023-07-12 LAB — CYTOLOGY, (ORAL, ANAL, URETHRAL) ANCILLARY ONLY
Chlamydia: POSITIVE — AB
Comment: NEGATIVE
Comment: NEGATIVE
Comment: NORMAL
Neisseria Gonorrhea: POSITIVE — AB
Trichomonas: NEGATIVE

## 2023-07-12 LAB — URINE CULTURE: Culture: NO GROWTH

## 2023-07-12 LAB — RPR: RPR Ser Ql: NONREACTIVE

## 2023-09-14 ENCOUNTER — Other Ambulatory Visit: Payer: Self-pay

## 2023-09-14 ENCOUNTER — Emergency Department
Admission: EM | Admit: 2023-09-14 | Discharge: 2023-09-14 | Disposition: A | Discharge status: Home / Self Care | Payer: Self-pay | Attending: Emergency Medicine | Admitting: Emergency Medicine

## 2023-09-14 ENCOUNTER — Emergency Department: Payer: Self-pay

## 2023-09-14 DIAGNOSIS — R569 Unspecified convulsions: Secondary | ICD-10-CM | POA: Insufficient documentation

## 2023-09-14 LAB — CBG MONITORING, ED
Glucose-Capillary: 109 mg/dL — ABNORMAL HIGH (ref 70–99)
Glucose-Capillary: 129 mg/dL — ABNORMAL HIGH (ref 70–99)
Glucose-Capillary: 65 mg/dL — ABNORMAL LOW (ref 70–99)

## 2023-09-14 LAB — CBC
HCT: 43.7 % (ref 39.0–52.0)
Hemoglobin: 14.1 g/dL (ref 13.0–17.0)
MCH: 26 pg (ref 26.0–34.0)
MCHC: 32.3 g/dL (ref 30.0–36.0)
MCV: 80.5 fL (ref 80.0–100.0)
Platelets: 284 K/uL (ref 150–400)
RBC: 5.43 MIL/uL (ref 4.22–5.81)
RDW: 13.6 % (ref 11.5–15.5)
WBC: 7.5 K/uL (ref 4.0–10.5)
nRBC: 0 % (ref 0.0–0.2)

## 2023-09-14 LAB — COMPREHENSIVE METABOLIC PANEL WITH GFR
ALT: 44 U/L (ref 0–44)
AST: 35 U/L (ref 15–41)
Albumin: 4 g/dL (ref 3.5–5.0)
Alkaline Phosphatase: 45 U/L (ref 38–126)
Anion gap: 12 (ref 5–15)
BUN: 13 mg/dL (ref 6–20)
CO2: 23 mmol/L (ref 22–32)
Calcium: 9.3 mg/dL (ref 8.9–10.3)
Chloride: 107 mmol/L (ref 98–111)
Creatinine, Ser: 0.96 mg/dL (ref 0.61–1.24)
GFR, Estimated: >60 mL/min (ref >60)
Glucose, Bld: 101 mg/dL — ABNORMAL HIGH (ref 70–99)
Potassium: 3.7 mmol/L (ref 3.5–5.1)
Sodium: 142 mmol/L (ref 135–145)
Total Bilirubin: 0.4 mg/dL (ref 0.0–1.2)
Total Protein: 7.1 g/dL (ref 6.5–8.1)

## 2023-09-14 MED ORDER — SODIUM CHLORIDE 0.9 % IV BOLUS
500.0000 mL | Freq: Once | INTRAVENOUS | Status: AC
  Administered 2023-09-14: 500 mL via INTRAVENOUS

## 2023-09-14 MED ORDER — LEVETIRACETAM (KEPPRA) 500 MG/5 ML ADULT IV PUSH
2000.0000 mg | Freq: Once | INTRAVENOUS | Status: AC
  Administered 2023-09-14: 2000 mg via INTRAVENOUS
  Filled 2023-09-14: qty 20

## 2023-09-14 MED ORDER — LEVETIRACETAM 500 MG PO TABS: 500.0000 mg | ORAL_TABLET | Freq: Two times a day (BID) | ORAL | 2 refills | Status: DC

## 2023-09-14 MED ORDER — OMEPRAZOLE MAGNESIUM 20 MG PO TBEC: 20.0000 mg | DELAYED_RELEASE_TABLET | Freq: Every day | ORAL | 2 refills | Status: DC

## 2023-09-14 NOTE — ED Notes (Signed)
 This paramedic transported pt to CT and stayed until finished.

## 2023-09-14 NOTE — ED Notes (Addendum)
 Pt refused CT scans x2 and states that he wants to go home. Pt appears lethargic and out of it, however can answer orientation questions. Pt continues trying to get out of bed and pull equipment/IV's out.

## 2023-09-14 NOTE — ED Notes (Signed)
 Pt is still lethargic however alerts to verbal stimuli and will answer questions. Pt having a hard time staying awake.

## 2023-09-14 NOTE — ED Notes (Signed)
 Although still tired, Pt able to follow commands, communicate, and answer questions appropriately.

## 2023-09-14 NOTE — ED Triage Notes (Signed)
 Pt BIB EMS for seizure activity at home.  5mg  versed given en route.

## 2023-09-14 NOTE — Discharge Instructions (Addendum)
 You are seen in the emergency department after a seizure.    Seizure precautions: Do not do anything that may harm yourself or others if you were to have a seizure while doing it.  This means no driving, no operating any heavy machinery, no heights, no swimming alone. Seizure precautions for 6 months or until you are cleared by a neurologist.  Call to schedule close follow up/out-patient follow up.

## 2023-09-14 NOTE — ED Notes (Signed)
 Pt is sleeping at this time, however will respond to verbal stimuli. PT is breathing normally and normal in color. Pt's family is at bedside with pt and attempting to get him to wake up. I explained to them that being lethargic after having seizures and being medicated for same is normal.

## 2023-09-14 NOTE — ED Provider Notes (Signed)
 [facility] Provider Note    Event Date/Time   First MD Initiated Contact with Patient 09/14/23 [phone]     (approximate)   History   Seizures   HPI  Jose Cowan is a 33 y.o. male past medical history significant for seizure disorder presents to the emergency department after having a seizure.  Patient has a history of seizures and per report is supposed to be taking Keppra  but has been noncompliant with his medication over the past couple of years.  According to EMS last seizure was approximately 4 years ago.  Called out for seizure-like activity.  Multiple witnessed seizures.  When EMS arrived patient was combative and was given 2.5 mg of Versed for agitation.  Then had another witnessed seizure and was given 2.5 mg IV of Versed.  Patient arrived with snoring respirations and appears postictal.  On chart review I can see that the patient has previously been on Keppra      Physical Exam   Triage Vital Signs: ED Triage Vitals  Encounter Vitals Group     BP      Girls Systolic BP Percentile      Girls Diastolic BP Percentile      Boys Systolic BP Percentile      Boys Diastolic BP Percentile      Pulse      Resp      Temp      Temp src      SpO2      Weight      Height      Head Circumference      Peak Flow      Pain Score      Pain Loc      Pain Education      Exclude from Growth Chart     Most recent vital signs: Vitals:   09/14/23 1030 09/14/23 1405  BP: 117/77 122/82  Pulse: 82 83  Resp:  18  Temp:  98.3 F (36.8 C)  SpO2: 97% 95%    Physical Exam Constitutional:      Appearance: He is well-developed.  HENT:     Head: Atraumatic.  Eyes:     Conjunctiva/sclera: Conjunctivae normal.     Pupils: Pupils are equal, round, and reactive to light.  Cardiovascular:     Rate and Rhythm: Regular rhythm.     Pulses: Normal pulses.     Heart sounds: No murmur heard. Pulmonary:     Effort: No respiratory distress.  Abdominal:      Tenderness: There is no abdominal tenderness.  Musculoskeletal:     Cervical back: Normal range of motion.     Right lower leg: No edema.     Left lower leg: No edema.  Skin:    General: Skin is warm.  Neurological:     Mental Status: He is alert.     IMPRESSION / MDM / ASSESSMENT AND PLAN / ED COURSE  I reviewed the triage vital signs and the nursing notes.  Differential diagnosis including breakthrough seizure secondary to medication noncompliance, electrolyte abnormality, intracranial hemorrhage, dehydration, infection  Patient received 5 mg of IV Versed prior to arrival and appears postictal.  Afebrile and hemodynamically stable.  Will get a glucose on arrival.  Plan for lab work and CT scan of the head given last seizure was multiple years ago per report.  Will load with 2 mg of IV Keppra   EKG  I, Clotilda Punter, the attending physician, personally viewed and  interpreted this ECG.   Rate: Normal  Rhythm: Normal sinus  Axis: Normal  Intervals: Normal  ST&T Change: None  No tachycardic or bradycardic dysrhythmias while on cardiac telemetry.  RADIOLOGY I independently reviewed imaging, my interpretation of imaging: CT scan of the head -no obvious intracranial hemorrhage  LABS (all labs ordered are listed, but only abnormal results are displayed) Labs interpreted as -    Labs Reviewed  COMPREHENSIVE METABOLIC PANEL WITH GFR - Abnormal; Notable for the following components:      Result Value   Glucose, Bld 101 (*)    All other components within normal limits  CBG MONITORING, ED - Abnormal; Notable for the following components:   Glucose-Capillary 109 (*)    All other components within normal limits  CBG MONITORING, ED - Abnormal; Notable for the following components:   Glucose-Capillary 65 (*)    All other components within normal limits  CBG MONITORING, ED - Abnormal; Notable for the following components:   Glucose-Capillary 129 (*)    All other components within  normal limits  CBC     MDM  Glucose 109 Clinical Course as of 09/14/23 1526  Fri Sep 14, 2023  1349 Can on reevaluation patient continues to be postictal.  Following simple commands and moving all extremities.  Patient adamantly refusing CT scan of his head. [SM]    Clinical Course User Index [SM] Suzanne Kirsch, MD   On reevaluation patient no longer postictal.  Answering questions appropriately and has a nonfocal neurologic exam.  Able to tolerate p.o. and repeat glucose was 120s.  Discussed seizure precautions and not driving for 6 months.  Discussed follow-up with neurology and called in Keppra .  Will start on a PPI.  PROCEDURES:  Critical Care performed: No  Procedures  Patient's presentation is most consistent with acute presentation with potential threat to life or bodily function.   MEDICATIONS ORDERED IN ED: Medications  levETIRAcetam  (KEPPRA ) undiluted injection 2,000 mg (2,000 mg Intravenous Given 09/14/23 0849)  sodium chloride  0.9 % bolus 500 mL (0 mLs Intravenous Stopped 09/14/23 1110)    FINAL CLINICAL IMPRESSION(S) / ED DIAGNOSES   Final diagnoses:  Seizure (HCC)     Rx / DC Orders   ED Discharge Orders          Ordered    levETIRAcetam  (KEPPRA ) 500 MG tablet  2 times daily        09/14/23 1520    omeprazole  (PRILOSEC  OTC) 20 MG tablet  Daily        09/14/23 1523             Note:  This document was prepared using Dragon voice recognition software and may include unintentional dictation errors.   Suzanne Kirsch, MD 09/14/23 1526

## 2023-09-14 NOTE — ED Notes (Addendum)
 Pt given juice per Dr. Suzanne for hypoglycemia.

## 2023-12-04 ENCOUNTER — Other Ambulatory Visit: Payer: Self-pay

## 2023-12-04 ENCOUNTER — Emergency Department: Payer: Self-pay

## 2023-12-04 ENCOUNTER — Encounter: Payer: Self-pay | Admitting: Emergency Medicine

## 2023-12-04 ENCOUNTER — Emergency Department
Admission: EM | Admit: 2023-12-04 | Discharge: 2023-12-04 | Disposition: A | Discharge status: Home / Self Care | Payer: Self-pay | Attending: Emergency Medicine | Admitting: Emergency Medicine

## 2023-12-04 DIAGNOSIS — R569 Unspecified convulsions: Secondary | ICD-10-CM | POA: Insufficient documentation

## 2023-12-04 DIAGNOSIS — D72829 Elevated white blood cell count, unspecified: Secondary | ICD-10-CM | POA: Insufficient documentation

## 2023-12-04 LAB — COMPREHENSIVE METABOLIC PANEL WITH GFR
ALT: 36 U/L (ref 0–44)
AST: 39 U/L (ref 15–41)
Albumin: 3.9 g/dL (ref 3.5–5.0)
Alkaline Phosphatase: 39 U/L (ref 38–126)
Anion gap: 14 (ref 5–15)
BUN: 12 mg/dL (ref 6–20)
CO2: 22 mmol/L (ref 22–32)
Calcium: 9 mg/dL (ref 8.9–10.3)
Chloride: 104 mmol/L (ref 98–111)
Creatinine, Ser: 1.32 mg/dL — ABNORMAL HIGH (ref 0.61–1.24)
GFR, Estimated: >60 mL/min (ref >60)
Glucose, Bld: 107 mg/dL — ABNORMAL HIGH (ref 70–99)
Potassium: 3.5 mmol/L (ref 3.5–5.1)
Sodium: 140 mmol/L (ref 135–145)
Total Bilirubin: 0.4 mg/dL (ref 0.0–1.2)
Total Protein: 6.7 g/dL (ref 6.5–8.1)

## 2023-12-04 LAB — ETHANOL: Alcohol, Ethyl (B): 207 mg/dL — ABNORMAL HIGH (ref <15)

## 2023-12-04 LAB — CBC WITH DIFFERENTIAL/PLATELET
Abs Immature Granulocytes: 0.05 K/uL (ref 0.00–0.07)
Basophils Absolute: 0 K/uL (ref 0.0–0.1)
Basophils Relative: 0 %
Eosinophils Absolute: 0 K/uL (ref 0.0–0.5)
Eosinophils Relative: 0 %
HCT: 50 % (ref 39.0–52.0)
Hemoglobin: 16.4 g/dL (ref 13.0–17.0)
Immature Granulocytes: 0 %
Lymphocytes Relative: 15 %
Lymphs Abs: 1.9 K/uL (ref 0.7–4.0)
MCH: 26.1 pg (ref 26.0–34.0)
MCHC: 32.8 g/dL (ref 30.0–36.0)
MCV: 79.5 fL — ABNORMAL LOW (ref 80.0–100.0)
Monocytes Absolute: 0.7 K/uL (ref 0.1–1.0)
Monocytes Relative: 5 %
Neutro Abs: 10.1 K/uL — ABNORMAL HIGH (ref 1.7–7.7)
Neutrophils Relative %: 80 %
Platelets: 286 K/uL (ref 150–400)
RBC: 6.29 MIL/uL — ABNORMAL HIGH (ref 4.22–5.81)
RDW: 13.4 % (ref 11.5–15.5)
WBC: 12.8 K/uL — ABNORMAL HIGH (ref 4.0–10.5)
nRBC: 0 % (ref 0.0–0.2)

## 2023-12-04 MED ORDER — LEVETIRACETAM 500 MG PO TABS: 500.0000 mg | ORAL_TABLET | Freq: Two times a day (BID) | ORAL | 2 refills | Status: DC

## 2023-12-04 NOTE — ED Notes (Signed)
 Willo, MD at bedside to reassess patient.

## 2023-12-04 NOTE — ED Triage Notes (Addendum)
 BIB ems from home for seizures.  Per ems fire said pt had a tonic clonic seizure with them came out of it and walked to the couch and sat down. He had been combative since we picked him up.  Takes keppra , but hasn't been taking bc he doesn't like the way it makes him feel.  A&ox4 on arrival, states I'm not going to have another seizure stress causes my seizures

## 2023-12-04 NOTE — ED Notes (Signed)
 Patient given discharge instructions including prescriptions x1 and importance of follow up appt as needed with stated understanding. INT removed, cannula intact, pressure dressing applied. Patient stable and wheeled out to safe ride from significant other on dispo.

## 2023-12-04 NOTE — ED Provider Notes (Signed)
 [facility] Provider Note    Event Date/Time   First MD Initiated Contact with Patient 12/04/23 0030     (approximate)   History   Chief Complaint Seizures   HPI  Jose Cowan is a 33 y.o. male with past medical history of seizures who presents to the ED complaining of seizures.  Per EMS, patient had generalized tonic-clonic seizure at home that was witnessed by his girlfriend.  EMS was contacted and he apparently had a second generalized seizure that was witnessed by the fire department.  Fire department reported to EMS that patient immediately stood up after the seizure and walked over to the couch.  Patient then became agitated and combative upon EMS arrival.  Patient reports history of seizures and has been prescribed Keppra , but states he has not been taking it because he does not like the way it makes him feel.  There was no reported fall or head injury this evening, patient does admit to drinking a bottle of liquor this evening as well as using marijuana, denies other drug use.  On arrival to the ED he states that he feels well, denies any tongue biting or urinary incontinence.  He states that his seizures in the past have been caused by stress.     Physical Exam   Triage Vital Signs: ED Triage Vitals [12/04/23 0027]  Encounter Vitals Group     BP (!) 143/90     Girls Systolic BP Percentile      Girls Diastolic BP Percentile      Boys Systolic BP Percentile      Boys Diastolic BP Percentile      Pulse Rate (!) 122     Resp 19     Temp 98.2 F (36.8 C)     Temp Source Oral     SpO2 100 %     Weight      Height      Head Circumference      Peak Flow      Pain Score      Pain Loc      Pain Education      Exclude from Growth Chart     Most recent vital signs: Vitals:   12/04/23 0204 12/04/23 0300  BP: 111/67 (!) 104/55  Pulse: 89 88  Resp: 18 19  Temp:    SpO2: 97% 95%    Constitutional: Alert and oriented. Eyes: Conjunctivae  are normal. Head: Atraumatic. Nose: No congestion/rhinnorhea. Mouth/Throat: Mucous membranes are moist.  Neck: No midline cervical spine tenderness to palpation. Cardiovascular: Tachycardic, regular rhythm. Grossly normal heart sounds.  2+ radial pulses bilaterally. Respiratory: Normal respiratory effort.  No retractions. Lungs CTAB. Gastrointestinal: Soft and nontender. No distention. Musculoskeletal: No lower extremity tenderness nor edema.  Neurologic:  Normal speech and language. No gross focal neurologic deficits are appreciated.    ED Results / Procedures / Treatments   Labs (all labs ordered are listed, but only abnormal results are displayed) Labs Reviewed  CBC WITH DIFFERENTIAL/PLATELET - Abnormal; Notable for the following components:      Result Value   WBC 12.8 (*)    RBC 6.29 (*)    MCV 79.5 (*)    Neutro Abs 10.1 (*)    All other components within normal limits  COMPREHENSIVE METABOLIC PANEL WITH GFR - Abnormal; Notable for the following components:   Glucose, Bld 107 (*)    Creatinine, Ser 1.32 (*)    All other components within normal  limits  ETHANOL - Abnormal; Notable for the following components:   Alcohol, Ethyl (B) 207 (*)    All other components within normal limits     EKG  ED ECG REPORT I, Carlin Palin, the attending physician, personally viewed and interpreted this ECG.   Date: 12/04/2023  EKG Time: 00:31  Rate: 120  Rhythm: sinus tachycardia  Axis: Normal  Intervals:none  ST&T Change: None  RADIOLOGY CT head reviewed and interpreted by me with no hemorrhage or midline shift.  PROCEDURES:  Critical Care performed: No  Procedures   MEDICATIONS ORDERED IN ED: Medications - No data to display   IMPRESSION / MDM / ASSESSMENT AND PLAN / ED COURSE  I reviewed the triage vital signs and the nursing notes.                              33 y.o. male with past medical history of seizures who presents to the ED following 2 reported  generalized tonic-clonic seizure episodes with significant agitation with EMS.  Patient's presentation is most consistent with acute presentation with potential threat to life or bodily function.  Differential diagnosis includes, but is not limited to, seizure, PNES, alcohol intoxication, intracranial injury, anemia, electrolyte abnormality, AKI.  Patient nontoxic-appearing and in no acute distress, vital signs remarkable for tachycardia but otherwise reassuring.  Patient currently states that he feels well and denies any complaints, was offered Keppra  to prevent any recurrent seizures but adamantly declines due to reported side effects.  We will hold off on anticonvulsant medication for now at patient request, unless he were to have an additional episode.  EKG shows sinus tachycardia with no ischemic changes, lab results are pending at this time.  He is currently calm and cooperative, but given it is unclear whether he fell and hit his head earlier, will check CT scan of his head.  No midline cervical spine tenderness to suggest C-spine injury.  CT head is negative for acute process, labs without significant anemia, leukocytosis, electrolyte abnormality, or AKI.  LFTs are unremarkable, ethanol level elevated at just above 200.  He was observed here in the ED with no additional seizure episodes, girlfriend now at bedside and asking to take patient home.  He is agreeable to restart Keppra , referral provided to neurology, and he was counseled that he cannot drive until cleared by neurology.  He was counseled to return to the ED for new or worsening symptoms, patient agrees with plan.      FINAL CLINICAL IMPRESSION(S) / ED DIAGNOSES   Final diagnoses:  Seizure-like activity (HCC)     Rx / DC Orders   ED Discharge Orders          Ordered    levETIRAcetam  (KEPPRA ) 500 MG tablet  2 times daily        12/04/23 0347             Note:  This document was prepared using Dragon voice  recognition software and may include unintentional dictation errors.   Palin Carlin, MD 12/04/23 [phone]

## 2023-12-15 ENCOUNTER — Observation Stay
Admission: EM | Admit: 2023-12-15 | Discharge: 2023-12-16 | Disposition: A | Discharge status: Home / Self Care | Payer: Self-pay | Attending: Internal Medicine | Admitting: Internal Medicine

## 2023-12-15 ENCOUNTER — Other Ambulatory Visit: Payer: Self-pay

## 2023-12-15 ENCOUNTER — Emergency Department: Payer: Self-pay

## 2023-12-15 DIAGNOSIS — R7401 Elevation of levels of liver transaminase levels: Secondary | ICD-10-CM | POA: Insufficient documentation

## 2023-12-15 DIAGNOSIS — R569 Unspecified convulsions: Principal | ICD-10-CM

## 2023-12-15 DIAGNOSIS — F12920 Cannabis use, unspecified with intoxication, uncomplicated: Secondary | ICD-10-CM | POA: Insufficient documentation

## 2023-12-15 DIAGNOSIS — G4089 Other seizures: Principal | ICD-10-CM | POA: Insufficient documentation

## 2023-12-15 DIAGNOSIS — F1010 Alcohol abuse, uncomplicated: Secondary | ICD-10-CM | POA: Insufficient documentation

## 2023-12-15 DIAGNOSIS — G40919 Epilepsy, unspecified, intractable, without status epilepticus: Secondary | ICD-10-CM | POA: Diagnosis present

## 2023-12-15 LAB — CBC WITH DIFFERENTIAL/PLATELET
Abs Immature Granulocytes: 0.02 K/uL (ref 0.00–0.07)
Basophils Absolute: 0 K/uL (ref 0.0–0.1)
Basophils Relative: 0 %
Eosinophils Absolute: 0.1 K/uL (ref 0.0–0.5)
Eosinophils Relative: 1 %
HCT: 42.5 % (ref 39.0–52.0)
Hemoglobin: 13.8 g/dL (ref 13.0–17.0)
Immature Granulocytes: 0 %
Lymphocytes Relative: 32 %
Lymphs Abs: 2.7 K/uL (ref 0.7–4.0)
MCH: 25.9 pg — ABNORMAL LOW (ref 26.0–34.0)
MCHC: 32.5 g/dL (ref 30.0–36.0)
MCV: 79.9 fL — ABNORMAL LOW (ref 80.0–100.0)
Monocytes Absolute: 1 K/uL (ref 0.1–1.0)
Monocytes Relative: 12 %
Neutro Abs: 4.6 K/uL (ref 1.7–7.7)
Neutrophils Relative %: 55 %
Platelets: 297 K/uL (ref 150–400)
RBC: 5.32 MIL/uL (ref 4.22–5.81)
RDW: 13.8 % (ref 11.5–15.5)
WBC: 8.4 K/uL (ref 4.0–10.5)
nRBC: 0 % (ref 0.0–0.2)

## 2023-12-15 LAB — SALICYLATE LEVEL: Salicylate Lvl: <7 mg/dL — ABNORMAL LOW (ref 7.0–30.0)

## 2023-12-15 LAB — COMPREHENSIVE METABOLIC PANEL WITH GFR
ALT: 37 U/L (ref 0–44)
AST: 43 U/L — ABNORMAL HIGH (ref 15–41)
Albumin: 3.6 g/dL (ref 3.5–5.0)
Alkaline Phosphatase: 39 U/L (ref 38–126)
Anion gap: 11 (ref 5–15)
BUN: 9 mg/dL (ref 6–20)
CO2: 23 mmol/L (ref 22–32)
Calcium: 8.7 mg/dL — ABNORMAL LOW (ref 8.9–10.3)
Chloride: 106 mmol/L (ref 98–111)
Creatinine, Ser: 1.2 mg/dL (ref 0.61–1.24)
GFR, Estimated: >60 mL/min (ref >60)
Glucose, Bld: 92 mg/dL (ref 70–99)
Potassium: 3.7 mmol/L (ref 3.5–5.1)
Sodium: 140 mmol/L (ref 135–145)
Total Bilirubin: 0.5 mg/dL (ref 0.0–1.2)
Total Protein: 6.3 g/dL — ABNORMAL LOW (ref 6.5–8.1)

## 2023-12-15 LAB — ETHANOL: Alcohol, Ethyl (B): 96 mg/dL — ABNORMAL HIGH (ref <15)

## 2023-12-15 LAB — ACETAMINOPHEN LEVEL: Acetaminophen (Tylenol), Serum: <10 ug/mL — ABNORMAL LOW (ref 10–30)

## 2023-12-15 MED ORDER — OXYCODONE HCL 5 MG PO TABS
5.0000 mg | ORAL_TABLET | Freq: Four times a day (QID) | ORAL | Status: DC | PRN
  Filled 2023-12-15: qty 1

## 2023-12-15 MED ORDER — LEVETIRACETAM (KEPPRA) 500 MG/5 ML ADULT IV PUSH
1000.0000 mg | INTRAVENOUS | Status: AC
  Administered 2023-12-15: 1000 mg via INTRAVENOUS
  Filled 2023-12-15: qty 10

## 2023-12-15 MED ORDER — PROCHLORPERAZINE EDISYLATE 10 MG/2ML IJ SOLN: 5.0000 mg | Freq: Four times a day (QID) | INTRAMUSCULAR | Status: DC | PRN

## 2023-12-15 MED ORDER — LORAZEPAM 2 MG/ML IJ SOLN: 2.0000 mg | Freq: Four times a day (QID) | INTRAMUSCULAR | Status: DC | PRN

## 2023-12-15 MED ORDER — ACETAMINOPHEN 325 MG PO TABS: 650.0000 mg | ORAL_TABLET | Freq: Four times a day (QID) | ORAL | Status: DC | PRN

## 2023-12-15 MED ORDER — SODIUM CHLORIDE 0.9 % IV BOLUS
1000.0000 mL | Freq: Once | INTRAVENOUS | Status: AC
  Administered 2023-12-15: 1000 mL via INTRAVENOUS

## 2023-12-15 MED ORDER — LEVETIRACETAM 500 MG PO TABS
500.0000 mg | ORAL_TABLET | Freq: Two times a day (BID) | ORAL | Status: DC
  Administered 2023-12-16 (×2): 500 mg via ORAL
  Filled 2023-12-15 (×2): qty 1

## 2023-12-15 MED ORDER — MELATONIN 5 MG PO TABS: 5.0000 mg | ORAL_TABLET | Freq: Every evening | ORAL | Status: DC | PRN

## 2023-12-15 MED ORDER — ENOXAPARIN SODIUM 60 MG/0.6ML IJ SOSY: 60.0000 mg | PREFILLED_SYRINGE | INTRAMUSCULAR | Status: DC

## 2023-12-15 MED ORDER — LACTATED RINGERS IV SOLN
INTRAVENOUS | Status: DC
Start: 2023-12-15 — End: 2023-12-16

## 2023-12-15 NOTE — H&P (Signed)
 History and Physical  Jose Cowan FMW:[id number] DOB: Jun 25, 1990 DOA: 12/15/2023  Referring physician: Dr. Viviann, EDP  PCP: [facility]  Outpatient Specialists: Neurology. Patient coming from: Home.  Chief Complaint: Breakthrough seizures.  HPI: Jose Cowan is a 33 y.o. male with medical history significant for Seizure disorder, genital herpes simplex, medication noncompliance, alcohol abuse, who presents to the ER due to 2 breakthrough seizures today.  Reportedly the patient has been missing doses of his home Keppra  for the past 2 days.  Has had multiple falls recently including falling on stairs.  In the ER, the patient is alert and oriented x 3.  Noncontrast head CT without any acute findings.  Lab studies overall unremarkable except for elevated alcohol level 96.  The patient received IV bolus of Keppra .  And was restarted on his home AEDs.  EDP requested admission for observation.  ED Course: Temperature 98.2.  118/76, pulse 77, respiration rate 18, O2 saturation 99% on room air.  Review of Systems: Review of systems as noted in the HPI. All other systems reviewed and are negative.   Past Medical History:  Diagnosis Date   Seizures (HCC)    History reviewed. No pertinent surgical history.  Social History:  reports that he has been smoking cigarettes. He has a 5 pack-year smoking history. He has never used smokeless tobacco. He reports current alcohol use. He reports current drug use. Frequency: 3.00 times per week. Drug: Marijuana.   No Known Allergies  Family history: None reported.  Prior to Admission medications   Medication Sig Start Date End Date Taking? Authorizing Provider  cetirizine  (ZYRTEC ) 10 MG tablet Take 1 tablet (10 mg total) by mouth daily as needed for allergies. 06/13/23   Brimage, Vondra, DO  levETIRAcetam  (KEPPRA ) 500 MG tablet Take 1 tablet (500 mg total) by mouth 2 (two) times daily. 12/04/23 03/03/24  Willo Dunnings, MD  omeprazole   (PRILOSEC  OTC) 20 MG tablet Take 1 tablet (20 mg total) by mouth daily. 09/14/23 12/13/23  Suzanne Kirsch, MD  valACYclovir (VALTREX) 500 MG tablet Take 500 mg by mouth daily. 07/08/18   [provider]    Physical Exam: BP (!) 143/88   Pulse 91   Temp 98.4 F (36.9 C) (Oral)   Resp 16   Ht 6' 3 (1.905 m)   Wt 117.9 kg   SpO2 98%   BMI 32.50 kg/m   General: 33 y.o. year-old male well developed well nourished in no acute distress.  Alert and oriented x3. Cardiovascular: Regular rate and rhythm with no rubs or gallops.  No thyromegaly or JVD noted.  No lower extremity edema. 2/4 pulses in all 4 extremities. Respiratory: Clear to auscultation with no wheezes or rales. Good inspiratory effort. Abdomen: Soft nontender nondistended with normal bowel sounds x4 quadrants. Muskuloskeletal: No cyanosis, clubbing or edema noted bilaterally Neuro: CN II-XII intact, strength, sensation, reflexes Skin: No ulcerative lesions noted or rashes Psychiatry: Judgement and insight appear normal. Mood is appropriate for condition and setting          Labs on Admission:  Basic Metabolic Panel: Recent Labs  Lab 12/15/23 2035  NA 140  K 3.7  CL 106  CO2 23  GLUCOSE 92  BUN 9  CREATININE 1.20  CALCIUM 8.7*   Liver Function Tests: Recent Labs  Lab 12/15/23 2035  AST 43*  ALT 37  ALKPHOS 39  BILITOT 0.5  PROT 6.3*  ALBUMIN 3.6   No results for input(s): LIPASE, AMYLASE in the last 168  hours. No results for input(s): AMMONIA in the last 168 hours. CBC: Recent Labs  Lab 12/15/23 2035  WBC 8.4  NEUTROABS 4.6  HGB 13.8  HCT 42.5  MCV 79.9*  PLT 297   Cardiac Enzymes: No results for input(s): CKTOTAL, CKMB, CKMBINDEX, TROPONINI in the last 168 hours.  BNP (last 3 results) No results for input(s): BNP in the last 8760 hours.  ProBNP (last 3 results) No results for input(s): PROBNP in the last 8760 hours.  CBG: No results for input(s): GLUCAP in the  last 168 hours.  Radiological Exams on Admission: CT Head Wo Contrast Result Date: 12/15/2023 CLINICAL DATA:  Head trauma, intracranial venous injury suspected Seizures. EXAM: CT HEAD WITHOUT CONTRAST TECHNIQUE: Contiguous axial images were obtained from the base of the skull through the vertex without intravenous contrast. RADIATION DOSE REDUCTION: This exam was performed according to the departmental dose-optimization program which includes automated exposure control, adjustment of the mA and/or kV according to patient size and/or use of iterative reconstruction technique. COMPARISON:  Head CT 12/04/2023 FINDINGS: Brain: No intracranial hemorrhage, mass effect, or midline shift. No hydrocephalus. The basilar cisterns are patent. No evidence of territorial infarct or acute ischemia. No extra-axial or intracranial fluid collection. Vascular: No hyperdense vessel or unexpected calcification. Skull: No fracture or focal lesion. Sinuses/Orbits: Improved mucosal thickening of the paranasal sinuses. Right maxillary sinus mucous retention cyst. No acute findings. Other: None. IMPRESSION: No acute intracranial abnormality. No skull fracture. Electronically Signed   By: Andrea Gasman M.D.   On: 12/15/2023 21:07    EKG: I independently viewed the EKG done and my findings are as followed: Sinus rhythm rate of 83.  Nonspecific ST-T changes QTc 421   Assessment/Plan Present on Admission:  Breakthrough seizure (HCC)  Principal Problem:   Breakthrough seizure (HCC)  Breakthrough seizures secondary to noncompliance Received bolus Keppra  Resume home Keppra  Seizure precautions IV Ativan for breakthrough seizures.  Alcohol abuse Alcohol level 96 CIWA protocol Multivitamins, thiamine, and folic acid supplements Recommend alcohol abuse cessation Brief counseling done at bedside.    Elevated AST level Likely contributed by alcohol abuse Recommend complete alcohol cessation.    Time: 75  minutes.    DVT prophylaxis: Subcu Lovenox daily.  Code Status: Full code.  Family Communication: None at bedside.  Disposition Plan: Admitted to telemetry medical unit.  Consults called: None.  Admission status: Observation status.   Status is: Observation    Terry LOISE Hurst MD [facility] Pager [phone]  If 7PM-7AM, please contact night-coverage [email or web address] Password [email or web address]  12/15/2023, 11:26 PM

## 2023-12-15 NOTE — ED Notes (Signed)
 Pt states he is not able to urinate at this time. Urinal provided to pt in order to obtain specimen.

## 2023-12-15 NOTE — ED Triage Notes (Signed)
 Pt BIB [facility] for 2 seizures in last hour. 1 unwitnessed & I witnessed. Pt was lying on bench during witnessed seizure & EMS reports pt did not fall from bench. Pt states he had a fall 1 week ago & has had a headache since then. Pt has hx of seizures on Keppra . Pt states he missed his dose of Keppra  today but has been taking in as prescribed.

## 2023-12-15 NOTE — ED Notes (Signed)
 Fall precautions in place

## 2023-12-15 NOTE — ED Provider Notes (Addendum)
 [facility] Provider Note    Event Date/Time   First MD Initiated Contact with Patient 12/15/23 2023     (approximate)   History   Chief Complaint: Seizures   HPI  Jose Cowan is a 33 y.o. male with a history of seizures who is brought to the ED due to seizure at work.  Patient reportedly had a seizure in his car prior to going into work, and then his coworkers witnessed him having another seizure which lasted about 10 minutes.  No falls or head trauma.  Patient reports he has missed Keppra  doses today, but has not run out.  Denies pain or illness. Patient also reportedly had a fall downstairs about a week ago and has had a headache since then.       Past Medical History:  Diagnosis Date  . Seizures (HCC)     Current Outpatient Rx  . Order #: [id number] Class: Normal  . Order #: [id number] Class: Normal  . Order #: [id number] Class: Normal  . Order #: [id number] Class: Historical Med    History reviewed. No pertinent surgical history.  Physical Exam   Triage Vital Signs: ED Triage Vitals [12/15/23 2025]  Encounter Vitals Group     BP      Girls Systolic BP Percentile      Girls Diastolic BP Percentile      Boys Systolic BP Percentile      Boys Diastolic BP Percentile      Pulse      Resp      Temp      Temp src      SpO2      Weight 260 lb (117.9 kg)     Height 6' 3 (1.905 m)     Head Circumference      Peak Flow      Pain Score 0     Pain Loc      Pain Education      Exclude from Growth Chart     Most recent vital signs: Vitals:   12/15/23 2031 12/15/23 2100  BP:  (!) 143/88  Pulse:  91  Resp:  16  Temp: 98.4 F (36.9 C)   SpO2:      General: Awake, no distress.  CV:  Good peripheral perfusion.  Regular rate rhythm Resp:  Normal effort.  Clear lungs Abd:  No distention.  Soft nontender Other:  Full range of motion all extremities.  No midline spinal tenderness, head atraumatic.   ED Results / Procedures / Treatments    Labs (all labs ordered are listed, but only abnormal results are displayed) Labs Reviewed  COMPREHENSIVE METABOLIC PANEL WITH GFR - Abnormal; Notable for the following components:      Result Value   Calcium 8.7 (*)    Total Protein 6.3 (*)    AST 43 (*)    All other components within normal limits  ETHANOL - Abnormal; Notable for the following components:   Alcohol, Ethyl (B) 96 (*)    All other components within normal limits  SALICYLATE LEVEL - Abnormal; Notable for the following components:   Salicylate Lvl <7.0 (*)    All other components within normal limits  ACETAMINOPHEN  LEVEL - Abnormal; Notable for the following components:   Acetaminophen  (Tylenol ), Serum <10 (*)    All other components within normal limits  CBC WITH DIFFERENTIAL/PLATELET - Abnormal; Notable for the following components:   MCV 79.9 (*)    MCH 25.9 (*)    All  other components within normal limits  URINE DRUG SCREEN, QUALITATIVE ([facility] ONLY)     EKG Interpreted by me Sinus rhythm rate of 83.  Normal axis and intervals.  Normal QRS ST segments and T waves   RADIOLOGY CT head interpreted by me, negative for intracranial hemorrhage.  Radiology report reviewed   PROCEDURES:  Procedures   MEDICATIONS ORDERED IN ED: Medications  sodium chloride  0.9 % bolus 1,000 mL (0 mLs Intravenous Stopped 12/15/23 2256)  levETIRAcetam  (KEPPRA ) undiluted injection 1,000 mg (1,000 mg Intravenous Given 12/15/23 2039)     IMPRESSION / MDM / ASSESSMENT AND PLAN / ED COURSE  I reviewed the triage vital signs and the nursing notes.  DDx: Seizure related to medication nonadherence, electrolyte derangement, dehydration, substance use, subdural hematoma  Patient's presentation is most consistent with acute presentation with potential threat to life or bodily function.  Patient presents with seizure in the setting of missing his last few doses of Keppra .  Also recently had head trauma with with persistent symptoms.   Will obtain CT head, check labs, give Keppra  IV.   ----------------------------------------- 10:59 PM on 12/15/2023 ----------------------------------------- CT unremarkable.  Labs show alcohol use.  Patient is awake and alert and reports not eating or drinking anything today, feeling very stressed.  Has had some intermittent left arm tremor. Family also notes multiple falls at home over the last few days. Will plan to hospitalize due to multiple seizures, multiple falls recently at home, poor med compliance.  Clinical Course as of 12/15/23 2325  Sat Dec 15, 2023  2315 Presents following a seizure with frequent falls/seizures this past week. Given keppra . Plan for admission  [SM]  2325 Case d/w hospitalist [PS]    Clinical Course User Index [PS] Viviann Pastor, MD [SM] Suzanne Kirsch, MD     FINAL CLINICAL IMPRESSION(S) / ED DIAGNOSES   Final diagnoses:  Seizures (HCC)     Rx / DC Orders   ED Discharge Orders     None        Note:  This document was prepared using Dragon voice recognition software and may include unintentional dictation errors.   Viviann Pastor, MD 12/15/23 2259    Viviann Pastor, MD 12/15/23 7697    Viviann Pastor, MD 12/15/23 2325

## 2023-12-15 NOTE — ED Notes (Signed)
 Patient transported to CT

## 2023-12-15 NOTE — ED Notes (Signed)
 This NT brought the patient a cup of water and attempted to impress upon him the importance of a urine sample to prevent a possible delay in care. Patient stated, Y'all ain't never asked for my pee before. What's the real reason y'all want my pee? Can't nobody be authorized to get my pee. I don't have to pee right now. This NT informed the patient that the provider requested an I&O cath to retrieve the urine sample. Patient stated, They put a tube in my thing before to get my pee? Y'all ain't ever asked for my pee before. I ain't obligated to do nothing. Told the patient to think on it for a few minutes, and someone would return to revisit the issue. No acute needs at this time.

## 2023-12-15 NOTE — ED Notes (Signed)
 EMS states CBG 88

## 2023-12-15 NOTE — Progress Notes (Signed)
 PHARMACIST - PHYSICIAN COMMUNICATION  CONCERNING:  Enoxaparin (Lovenox) for DVT Prophylaxis    RECOMMENDATION: Patient was prescribed enoxaprin 40mg  q24 hours for VTE prophylaxis.   Filed Weights   12/15/23 2025  Weight: 117.9 kg (260 lb)    Body mass index is 32.5 kg/m.  Estimated Creatinine Clearance: 123.5 mL/min (by C-G formula based on SCr of 1.2 mg/dL).   Based on [facility] policy patient is candidate for enoxaparin 0.5mg /kg TBW SQ every 24 hours based on BMI being >30.  DESCRIPTION: Pharmacy has adjusted enoxaparin dose per [facility] policy.  Patient is now receiving enoxaparin 0.5 mg/kg every 24 hours   Rankin CANDIE Dills, PharmD, [facility] 12/15/2023 11:32 PM

## 2023-12-15 NOTE — ED Notes (Signed)
 This tech did intentional rounding at this time. Pt expressed concerns about giving a urine sample and wanting to leave. This tech encouraged pt to stay and follow through with care. Call bell is within reach and pt comfortable at this time.

## 2023-12-16 LAB — CBC
HCT: 40.9 % (ref 39.0–52.0)
Hemoglobin: 13.4 g/dL (ref 13.0–17.0)
MCH: 26.2 pg (ref 26.0–34.0)
MCHC: 32.8 g/dL (ref 30.0–36.0)
MCV: 80 fL (ref 80.0–100.0)
Platelets: 292 K/uL (ref 150–400)
RBC: 5.11 MIL/uL (ref 4.22–5.81)
RDW: 13.8 % (ref 11.5–15.5)
WBC: 7.3 K/uL (ref 4.0–10.5)
nRBC: 0 % (ref 0.0–0.2)

## 2023-12-16 LAB — BASIC METABOLIC PANEL WITH GFR
Anion gap: 9 (ref 5–15)
BUN: 10 mg/dL (ref 6–20)
CO2: 26 mmol/L (ref 22–32)
Calcium: 8.5 mg/dL — ABNORMAL LOW (ref 8.9–10.3)
Chloride: 107 mmol/L (ref 98–111)
Creatinine, Ser: 1.26 mg/dL — ABNORMAL HIGH (ref 0.61–1.24)
GFR, Estimated: >60 mL/min (ref >60)
Glucose, Bld: 86 mg/dL (ref 70–99)
Potassium: 3.6 mmol/L (ref 3.5–5.1)
Sodium: 142 mmol/L (ref 135–145)

## 2023-12-16 LAB — PHOSPHORUS: Phosphorus: 2.2 mg/dL — ABNORMAL LOW (ref 2.5–4.6)

## 2023-12-16 LAB — MAGNESIUM: Magnesium: 2.3 mg/dL (ref 1.7–2.4)

## 2023-12-16 MED ORDER — LEVETIRACETAM 500 MG PO TABS
500.0000 mg | ORAL_TABLET | Freq: Two times a day (BID) | ORAL | 1 refills | Status: AC
Start: 2023-12-16 — End: 2024-02-14

## 2023-12-16 MED ORDER — LORAZEPAM 1 MG PO TABS: 1.0000 mg | ORAL_TABLET | ORAL | Status: DC | PRN

## 2023-12-16 MED ORDER — LORAZEPAM 2 MG/ML IJ SOLN: 1.0000 mg | INTRAMUSCULAR | Status: DC | PRN

## 2023-12-16 MED ORDER — K PHOS MONO-SOD PHOS DI & MONO 155-852-130 MG PO TABS
500.0000 mg | ORAL_TABLET | Freq: Three times a day (TID) | ORAL | Status: DC
  Administered 2023-12-16: 500 mg via ORAL
  Filled 2023-12-16: qty 2

## 2023-12-16 MED ORDER — THIAMINE HCL 100 MG/ML IJ SOLN: 100.0000 mg | Freq: Every day | INTRAMUSCULAR | Status: DC

## 2023-12-16 MED ORDER — THIAMINE MONONITRATE 100 MG PO TABS
100.0000 mg | ORAL_TABLET | Freq: Every day | ORAL | Status: DC
  Administered 2023-12-16: 100 mg via ORAL
  Filled 2023-12-16: qty 1

## 2023-12-16 MED ORDER — ADULT MULTIVITAMIN W/MINERALS CH
1.0000 | ORAL_TABLET | Freq: Every day | ORAL | Status: DC
  Administered 2023-12-16: 1 via ORAL
  Filled 2023-12-16: qty 1

## 2023-12-16 MED ORDER — FOLIC ACID 1 MG PO TABS
1.0000 mg | ORAL_TABLET | Freq: Every day | ORAL | Status: DC
  Administered 2023-12-16: 1 mg via ORAL
  Filled 2023-12-16: qty 1

## 2023-12-16 NOTE — Discharge Summary (Signed)
 Physician Discharge Summary  Jose Cowan FMW:[id number] DOB: 23-Apr-1990 DOA: 12/15/2023  PCP: [facility]  Admit date: 12/15/2023 Discharge date: 12/16/2023  Admitted From: Home Disposition:  Home  Recommendations for Outpatient Follow-up:  Follow up with PCP in 1-2 weeks Ambulatory referral to neurology  Home Health:No  Equipment/Devices:None   Discharge Condition:Stable  CODE STATUS:FULL  Diet recommendation: Regular  Brief/Interim Summary:  33 y.o. male with medical history significant for Seizure disorder, genital herpes simplex, medication noncompliance, alcohol abuse, who presents to the ER due to 2 breakthrough seizures today.  Reportedly the patient has been missing doses of his home Keppra  for the past 2 days.  Has had multiple falls recently including falling on stairs.   In the ER, the patient is alert and oriented x 3.  Noncontrast head CT without any acute findings.  Lab studies overall unremarkable except for elevated alcohol level 96.  The patient received IV bolus of Keppra .  And was restarted on his home AEDs.  EDP requested admission for observation.    Discharge Diagnoses:  Principal Problem:   Breakthrough seizure (HCC)   Breakthrough seizures Suspect secondary to nonadherence to use of prescribed medications Patient received IV bolus Keppra  load in ED.  Was admitted for observation.  No breakthrough seizures noted.  Patient stable back to baseline at time of discharge.  Resume home Keppra  500 mg p.o. twice daily.  Patient counseled and educated on the importance of medication adherence.  Expressed understanding.  Patient's significant other was on the phone.  Ambulatory referral to [facility] neurology placed time of discharge   Alcohol abuse Alcohol level 96 Counseled on cessation   Elevated AST level Likely contributed by alcohol abuse Recommend complete alcohol cessation.  Patient is uninsured.  Resources uploaded to AVS for patient to  [facility]  TOC will also notify financial navigator to follow-up with patient regarding Medicaid eligibility.  Ambulatory referral to [facility] neurology has been placed at time of discharge    Discharge Instructions  Discharge Instructions     Ambulatory referral to Neurology   Complete by: As directed    Seizure disorder   Diet - low sodium heart healthy   Complete by: As directed    Increase activity slowly   Complete by: As directed       Allergies as of 12/16/2023   No Known Allergies      Medication List     STOP taking these medications    cetirizine  10 MG tablet Commonly known as: ZYRTEC    omeprazole  20 MG tablet Commonly known as: PriLOSEC  OTC   valACYclovir 500 MG tablet Commonly known as: VALTREX       TAKE these medications    ibuprofen 200 MG tablet Commonly known as: ADVIL Take 200-800 mg by mouth every 6 (six) hours as needed for mild pain (pain score 1-3) or headache.   levETIRAcetam  500 MG tablet Commonly known as: Keppra  Take 1 tablet (500 mg total) by mouth 2 (two) times daily.        No Known Allergies  Consultations: None   Procedures/Studies: CT Head Wo Contrast Result Date: 12/15/2023 CLINICAL DATA:  Head trauma, intracranial venous injury suspected Seizures. EXAM: CT HEAD WITHOUT CONTRAST TECHNIQUE: Contiguous axial images were obtained from the base of the skull through the vertex without intravenous contrast. RADIATION DOSE REDUCTION: This exam was performed according to the departmental dose-optimization program which includes automated exposure control, adjustment of the mA and/or kV according to  patient size and/or use of iterative reconstruction technique. COMPARISON:  Head CT 12/04/2023 FINDINGS: Brain: No intracranial hemorrhage, mass effect, or midline shift. No hydrocephalus. The basilar cisterns are patent. No evidence of territorial infarct or acute ischemia. No extra-axial or  intracranial fluid collection. Vascular: No hyperdense vessel or unexpected calcification. Skull: No fracture or focal lesion. Sinuses/Orbits: Improved mucosal thickening of the paranasal sinuses. Right maxillary sinus mucous retention cyst. No acute findings. Other: None. IMPRESSION: No acute intracranial abnormality. No skull fracture. Electronically Signed   By: Andrea Gasman M.D.   On: 12/15/2023 21:07   CT Head Wo Contrast Result Date: 12/04/2023 EXAM: CT HEAD WITHOUT CONTRAST 12/04/2023 01:22:07 AM TECHNIQUE: CT of the head was performed without the administration of intravenous contrast. Automated exposure control, iterative reconstruction, and/or weight based adjustment of the mA/kV was utilized to reduce the radiation dose to as low as reasonably achievable. COMPARISON: 09/14/2023 CLINICAL HISTORY: Seizure disorder, clinical change. BIB ems from home for seizures. Per ems fire said pt had a tonic clonic seizure with them came out of it and walked to the couch and sat down. He had been combative since we picked him up. Takes keppra , but hasn't been taking bc he doesn't like the way it makes him feel. A\T\ox4 on arrival, states I'm not going to have another seizure stress causes my seizures. FINDINGS: BRAIN AND VENTRICLES: No acute hemorrhage. No evidence of acute infarct. No hydrocephalus. No extra-axial collection. No mass effect or midline shift. ORBITS: No acute abnormality. SINUSES: Mild mucosal thickening in the left ethmoid and bilateral maxillary sinuses. Mastoid air cells are clear. SOFT TISSUES AND SKULL: No acute soft tissue abnormality. No skull fracture. IMPRESSION: 1. No acute intracranial abnormality. Electronically signed by: Pinkie Pebbles MD 12/04/2023 01:27 AM EDT RP Workstation: [id number]      Subjective: Seen and examined the day of discharge.  Stable no distress.  Appropriate for discharge home.  Discharge Exam: Vitals:   12/16/23 0357 12/16/23 0735  BP: 112/71  126/81  Pulse: 84 73  Resp:  14  Temp: 98 F (36.7 C) 98.6 F (37 C)  SpO2: 98% 96%   Vitals:   12/15/23 2330 12/16/23 0003 12/16/23 0357 12/16/23 0735  BP: (!) 135/90 118/76 112/71 126/81  Pulse: 70 77 84 73  Resp: 13   14  Temp: 98.4 F (36.9 C) 98.2 F (36.8 C) 98 F (36.7 C) 98.6 F (37 C)  TempSrc: Oral Oral    SpO2: 98% 99% 98% 96%  Weight:      Height:        General: Pt is alert, awake, not in acute distress Cardiovascular: RRR, S1/S2 +, no rubs, no gallops Respiratory: CTA bilaterally, no wheezing, no rhonchi Abdominal: Soft, NT, ND, bowel sounds + Extremities: no edema, no cyanosis    The results of significant diagnostics from this hospitalization (including imaging, microbiology, ancillary and laboratory) are listed below for reference.     Microbiology: No results found for this or any previous visit (from the past 240 hours).   Labs: BNP (last 3 results) No results for input(s): BNP in the last 8760 hours. Basic Metabolic Panel: Recent Labs  Lab 12/15/23 2035 12/16/23 0314  NA 140 142  K 3.7 3.6  CL 106 107  CO2 23 26  GLUCOSE 92 86  BUN 9 10  CREATININE 1.20 1.26*  CALCIUM 8.7* 8.5*  MG  --  2.3  PHOS  --  2.2*   Liver Function Tests: Recent Labs  Lab 12/15/23 2035  AST 43*  ALT 37  ALKPHOS 39  BILITOT 0.5  PROT 6.3*  ALBUMIN 3.6   No results for input(s): LIPASE, AMYLASE in the last 168 hours. No results for input(s): AMMONIA in the last 168 hours. CBC: Recent Labs  Lab 12/15/23 2035 12/16/23 0314  WBC 8.4 7.3  NEUTROABS 4.6  --   HGB 13.8 13.4  HCT 42.5 40.9  MCV 79.9* 80.0  PLT 297 292   Cardiac Enzymes: No results for input(s): CKTOTAL, CKMB, CKMBINDEX, TROPONINI in the last 168 hours. BNP: Invalid input(s): POCBNP CBG: No results for input(s): GLUCAP in the last 168 hours. D-Dimer No results for input(s): DDIMER in the last 72 hours. Hgb A1c No results for input(s): HGBA1C in the  last 72 hours. Lipid Profile No results for input(s): CHOL, HDL, LDLCALC, TRIG, CHOLHDL, LDLDIRECT in the last 72 hours. Thyroid function studies No results for input(s): TSH, T4TOTAL, T3FREE, THYROIDAB in the last 72 hours.  Invalid input(s): FREET3 Anemia work up No results for input(s): VITAMINB12, FOLATE, FERRITIN, TIBC, IRON, RETICCTPCT in the last 72 hours. Urinalysis    Component Value Date/Time   COLORURINE YELLOW 07/11/2023 1115   APPEARANCEUR HAZY (A) 07/11/2023 1115   APPEARANCEUR Clear 09/20/2012 1400   LABSPEC 1.020 07/11/2023 1115   LABSPEC 1.017 09/20/2012 1400   PHURINE 8.5 (H) 07/11/2023 1115   GLUCOSEU NEGATIVE 07/11/2023 1115   GLUCOSEU Negative 09/20/2012 1400   HGBUR TRACE (A) 07/11/2023 1115   BILIRUBINUR NEGATIVE 07/11/2023 1115   BILIRUBINUR Negative 09/20/2012 1400   KETONESUR NEGATIVE 07/11/2023 1115   PROTEINUR 100 (A) 07/11/2023 1115   NITRITE NEGATIVE 07/11/2023 1115   LEUKOCYTESUR MODERATE (A) 07/11/2023 1115   LEUKOCYTESUR Negative 09/20/2012 1400   Sepsis Labs Recent Labs  Lab 12/15/23 2035 12/16/23 0314  WBC 8.4 7.3   Microbiology No results found for this or any previous visit (from the past 240 hours).   Time coordinating discharge: 40 minutes  SIGNED:   Calvin KATHEE Robson, MD  [facility] 12/16/2023, 12:07 PM Pager   If 7PM-7AM, please contact night-coverage

## 2023-12-16 NOTE — TOC Initial Note (Signed)
 Transition of Care [facility]) - Initial/Assessment Note    Patient Details  Name: Masaru Almanzar MRN: [id number] Date of Birth: 13-Jun-1990  Transition [facility]) CM/SW Contact:    [place] L Jaanai Salemi, LCSW Phone Number: 12/16/2023, 8:06 AM  Clinical Narrative:                  [facility] consult received for substance abuse education/counseling. TOC does not provide education and counseling. Resources have been uploaded onto the AVS.   Please advise of any TOC needs.        Patient Goals and CMS Choice            Expected Discharge Plan and Services                                              Prior Living Arrangements/Services                       Activities of Daily Living   ADL Screening (condition at time of admission) Independently performs ADLs?: Yes (appropriate for developmental age)  Permission Sought/Granted                  Emotional Assessment              Admission diagnosis:  Seizures (HCC) [R56.9] Breakthrough seizure [facility]) [G40.919] Patient Active Problem List   Diagnosis Date Noted   Breakthrough seizure (HCC) 12/15/2023   PCP:  [facility] Pharmacy:   [facility] [phone] - [place], [place] - [address] AT [facility] OF [address] [place] [address] Phone: [phone] Fax: [phone]  [facility] [address] [place], [place] - [address] [place] [address] Phone: [phone] Fax: [phone]     Social Drivers of Health (SDOH) Social History: SDOH Screenings   Tobacco Use: High Risk (12/15/2023)   SDOH Interventions:     Readmission Risk Interventions     No data to display

## 2023-12-16 NOTE — TOC Transition Note (Signed)
 Transition of Care [facility]) - Discharge Note   Patient Details  Name: Jose Cowan MRN: [id number] Date of Birth: 07-Nov-1990  [facility]) CM/SW Contact:  [place] L Shenise Wolgamott, LCSW Phone Number: 12/16/2023, 9:57 AM   Clinical Narrative:      CSW received a secure chat. Patient is uninsured. Resources uploaded to the AVS for patient. CSW also sent a secure chat for the Financial Navigator to follow-up with patient regarding Medicaid eligibility.   TOC signing off.        Patient Goals and CMS Choice            Discharge Placement                       Discharge Plan and Services Additional resources added to the After Visit Summary for                                       Social Drivers of Health (SDOH) Interventions SDOH Screenings   Tobacco Use: High Risk (12/15/2023)     Readmission Risk Interventions     No data to display

## 2023-12-16 NOTE — Discharge Instructions (Addendum)
 Outpatient Substance Use Treatment Services   [facility] [address] [place], [place] [address]  [phone] Private insurance, Medicare A&B, and [facility]   [facility])  [address],  [place], [place] [address] [phone] Medicaid, Self Pay   [facility]      [address]  [place], [place] [phone] Medicaid and [facility], Self Pay   [facility] [address]  [place], [place]  [phone] [facility], and Self Pay  [facility]      [address]    [place], [place] [address]  [phone] or [phone] Private Insurance Only                 [facility] [address]  [place], [place]  [address] [phone] Medicaid, Medicare, Private Insurance  [facility] [address]  [place], [place] [address] [phone] Services are free or reduced  [facility]  [address] [phone]  Self Pay only, sliding scale  [facility]  [address]  [place], [place] [address] [phone] ([facility]) Self Pay, Medicaid Only   [facility] [address], [place] [address] [phone] Medicaid, Medicare, Private Insurance  Primary Care Provider/Clinic that serves low-income and uninsured.  [email or web address]  You will receive a call from a [place] Financial Navigator to discuss your eligibility or Medicaid or you can visit the local DSS agency to apply. Applications are accepted online using the [employer] website.

## 2023-12-16 NOTE — Plan of Care (Signed)
  Problem: Clinical Measurements: Goal: Ability to maintain clinical measurements within normal limits will improve Outcome: Progressing Goal: Cardiovascular complication will be avoided Outcome: Progressing   Problem: Coping: Goal: Level of anxiety will decrease Outcome: Progressing   Problem: Safety: Goal: Ability to remain free from injury will improve Outcome: Progressing

## 2024-01-21 ENCOUNTER — Emergency Department: Payer: Self-pay

## 2024-01-21 ENCOUNTER — Other Ambulatory Visit: Payer: Self-pay

## 2024-01-21 DIAGNOSIS — R0602 Shortness of breath: Secondary | ICD-10-CM | POA: Insufficient documentation

## 2024-01-21 DIAGNOSIS — R42 Dizziness and giddiness: Secondary | ICD-10-CM | POA: Insufficient documentation

## 2024-01-21 LAB — BASIC METABOLIC PANEL WITH GFR
Anion gap: 9 (ref 5–15)
BUN: 12 mg/dL (ref 6–20)
CO2: 23 mmol/L (ref 22–32)
Calcium: 8.3 mg/dL — ABNORMAL LOW (ref 8.9–10.3)
Chloride: 104 mmol/L (ref 98–111)
Creatinine, Ser: 1.52 mg/dL — ABNORMAL HIGH (ref 0.61–1.24)
GFR, Estimated: >60 mL/min
Glucose, Bld: 99 mg/dL (ref 70–99)
Potassium: 3.5 mmol/L (ref 3.5–5.1)
Sodium: 136 mmol/L (ref 135–145)

## 2024-01-21 LAB — CBC WITH DIFFERENTIAL/PLATELET
Abs Immature Granulocytes: 0.04 10*3/uL (ref 0.00–0.07)
Basophils Absolute: 0 10*3/uL (ref 0.0–0.1)
Basophils Relative: 0 %
Eosinophils Absolute: 0.2 10*3/uL (ref 0.0–0.5)
Eosinophils Relative: 3 %
HCT: 50.2 % (ref 39.0–52.0)
Hemoglobin: 16.3 g/dL (ref 13.0–17.0)
Immature Granulocytes: 0 %
Lymphocytes Relative: 19 %
Lymphs Abs: 1.7 10*3/uL (ref 0.7–4.0)
MCH: 25.4 pg — ABNORMAL LOW (ref 26.0–34.0)
MCHC: 32.5 g/dL (ref 30.0–36.0)
MCV: 78.3 fL — ABNORMAL LOW (ref 80.0–100.0)
Monocytes Absolute: 1 10*3/uL (ref 0.1–1.0)
Monocytes Relative: 10 %
Neutro Abs: 6.4 10*3/uL (ref 1.7–7.7)
Neutrophils Relative %: 68 %
Platelets: 262 10*3/uL (ref 150–400)
RBC: 6.41 MIL/uL — ABNORMAL HIGH (ref 4.22–5.81)
RDW: 13.2 % (ref 11.5–15.5)
WBC: 9.4 10*3/uL (ref 4.0–10.5)
nRBC: 0 % (ref 0.0–0.2)

## 2024-01-21 NOTE — ED Triage Notes (Signed)
 Pt reports he has been having shortness of breath and feeling light headed for the past few hours. Pt reports he fell prior to coming due to the lightheadedness and hit the back of his head. Pt is unsure of LOC. Pt has hx seizures, pt reports he missed his dose today.

## 2024-01-22 ENCOUNTER — Emergency Department
Admission: EM | Admit: 2024-01-22 | Discharge: 2024-01-22 | Disposition: A | Discharge status: Home / Self Care | Payer: Self-pay | Attending: Emergency Medicine | Admitting: Emergency Medicine

## 2024-01-22 DIAGNOSIS — R0602 Shortness of breath: Secondary | ICD-10-CM

## 2024-01-22 DIAGNOSIS — R42 Dizziness and giddiness: Secondary | ICD-10-CM

## 2024-01-22 LAB — RESP PANEL BY RT-PCR (RSV, FLU A&B, COVID)  RVPGX2
Influenza A by PCR: NEGATIVE
Influenza B by PCR: NEGATIVE
Resp Syncytial Virus by PCR: NEGATIVE
SARS Coronavirus 2 by RT PCR: NEGATIVE

## 2024-01-22 LAB — URINALYSIS, ROUTINE W REFLEX MICROSCOPIC
Bilirubin Urine: NEGATIVE
Glucose, UA: NEGATIVE mg/dL
Hgb urine dipstick: NEGATIVE
Ketones, ur: NEGATIVE mg/dL
Nitrite: NEGATIVE
Protein, ur: NEGATIVE mg/dL
Specific Gravity, Urine: 1.021 (ref 1.005–1.030)
pH: 5 (ref 5.0–8.0)

## 2024-01-22 LAB — D-DIMER, QUANTITATIVE: D-Dimer, Quant: 0.31 ug{FEU}/mL (ref 0.00–0.50)

## 2024-01-22 LAB — TROPONIN I (HIGH SENSITIVITY): Troponin I (High Sensitivity): <2 ng/L (ref <18)

## 2024-01-22 MED ORDER — LEVETIRACETAM 500 MG PO TABS
500.0000 mg | ORAL_TABLET | Freq: Once | ORAL | Status: AC
  Administered 2024-01-22: 500 mg via ORAL
  Filled 2024-01-22: qty 1

## 2024-01-22 MED ORDER — ACETAMINOPHEN 325 MG PO TABS
975.0000 mg | ORAL_TABLET | Freq: Once | ORAL | Status: AC
  Administered 2024-01-22: 975 mg via ORAL
  Filled 2024-01-22: qty 3

## 2024-01-22 MED ORDER — SODIUM CHLORIDE 0.9 % IV BOLUS
1000.0000 mL | Freq: Once | INTRAVENOUS | Status: AC
  Administered 2024-01-22: 1000 mL via INTRAVENOUS

## 2024-01-22 NOTE — ED Provider Notes (Signed)
 [facility] Provider Note    Event Date/Time   First MD Initiated Contact with Patient 01/22/24 0120     (approximate)   History   Dizziness and Shortness of Breath   HPI  Jose Cowan is a 33 year old male presenting to the emergency department for evaluation of shortness of breath and lightheadedness.  Reports that this afternoon noted that he felt short of breath with some chest pain starting around 3 PM.  Also reports feeling lightheaded.  Reports that he went to a gas station on his way here and thinks he passed out.  Does not think he had a seizure, but reports he has not taken his seizure medication today.  Does think he hit his head.  Denies numbness, tingling, focal weakness.     Physical Exam   Triage Vital Signs: ED Triage Vitals  Encounter Vitals Group     BP 01/21/24 2244 (!) 134/100     Girls Systolic BP Percentile --      Girls Diastolic BP Percentile --      Boys Systolic BP Percentile --      Boys Diastolic BP Percentile --      Pulse Rate 01/21/24 2244 (!) 106     Resp 01/21/24 2244 20     Temp 01/21/24 2244 98.2 F (36.8 C)     Temp src --      SpO2 01/21/24 2244 100 %     Weight 01/21/24 2242 244 lb (110.7 kg)     Height 01/21/24 2242 6' 3 (1.905 m)     Head Circumference --      Peak Flow --      Pain Score 01/21/24 2242 8     Pain Loc --      Pain Education --      Exclude from Growth Chart --     Most recent vital signs: Vitals:   01/22/24 0231 01/22/24 0313  BP:  120/74  Pulse:  72  Resp:  16  Temp: 97.9 F (36.6 C)   SpO2:  96%     General: Awake, interactive  Head:  Atraumatic CV:  Good peripheral perfusion Resp:  Unlabored respirations, lungs clear to auscultation Abd:  Nondistended, soft, nontender Neuro:  Alert and oriented, normal extraocular movements, symmetric facial movement, sensation intact over bilateral upper and lower extremities with 5 out of 5 strength.  Normal finger-to-nose  testing.   ED Results / Procedures / Treatments   Labs (all labs ordered are listed, but only abnormal results are displayed) Labs Reviewed  CBC WITH DIFFERENTIAL/PLATELET - Abnormal; Notable for the following components:      Result Value   RBC 6.41 (*)    MCV 78.3 (*)    MCH 25.4 (*)    All other components within normal limits  BASIC METABOLIC PANEL WITH GFR - Abnormal; Notable for the following components:   Creatinine, Ser 1.52 (*)    Calcium 8.3 (*)    All other components within normal limits  URINALYSIS, ROUTINE W REFLEX MICROSCOPIC - Abnormal; Notable for the following components:   Color, Urine YELLOW (*)    APPearance HAZY (*)    Leukocytes,Ua SMALL (*)    Bacteria, UA FEW (*)    All other components within normal limits  RESP PANEL BY RT-PCR (RSV, FLU A&B, COVID)  [id number]  URINE CULTURE  D-DIMER, QUANTITATIVE  TROPONIN I (HIGH SENSITIVITY)     EKG EKG independently reviewed and interpreted by myself demonstrates:  EKG demonstrates normal sinus rhythm at a rate of 98, PR 126, QRS 72, QTc 408, no acute ST changes  RADIOLOGY Imaging independently reviewed and interpreted by myself demonstrates:  CXR without focal consolidation  Formal Radiology Read:  DG Chest 2 View Result Date: 01/21/2024 EXAM: 2 VIEW(S) XRAY OF THE CHEST 01/21/2024 10:58:00 PM COMPARISON: None available. CLINICAL HISTORY: sob FINDINGS: LUNGS AND PLEURA: No focal pulmonary opacity. No pulmonary edema. No pleural effusion. No pneumothorax. HEART AND MEDIASTINUM: No acute abnormality of the cardiac and mediastinal silhouettes. BONES AND SOFT TISSUES: No acute osseous abnormality. IMPRESSION: 1. Normal chest radiographs. Electronically signed by: Pinkie Pebbles MD 01/21/2024 11:02 PM EST RP Workstation: [id number]    PROCEDURES:  Critical Care performed: No  Procedures   MEDICATIONS ORDERED IN ED: Medications  levETIRAcetam  (KEPPRA ) tablet 500 mg (500 mg Oral Given 01/22/24 0202)   sodium chloride  0.9 % bolus 1,000 mL (0 mLs Intravenous Stopped 01/22/24 0315)  acetaminophen  (TYLENOL ) tablet 975 mg (975 mg Oral Given 01/22/24 0230)     IMPRESSION / MDM / ASSESSMENT AND PLAN / ED COURSE  I reviewed the triage vital signs and the nursing notes.  Differential diagnosis includes, but is not limited to, pneumonia, anemia, electrolyte abnormality, viral illness, arrhythmia, breakthrough seizure in the setting of medication nonadherence, ACS, PE, low suspicion intracranial bleed, no indication for head or spine imaging by Canadian and Nexus criteria  Patient's presentation is most consistent with acute presentation with potential threat to life or bodily function.  33 year old male presenting with shortness of breath, lightheadedness with syncopal episode.  Mild tachycardia on presentation.  Labs with reassuring CBC, BMP with mild AKI with creatinine of 1.52.  Ordered for IV fluids, missed dose of Keppra .  Will also obtain troponin and D-dimer given reported shortness of breath and chest pain, cannot rule out by [facility] criteria.  X-Jose Cowan and EKG overall reassuring.  Troponin normal and greater than 3 hours of pain, normal d-dimer.  Urinalysis equivocal.  Patient denying any urinary symptoms, will send culture but hold off on treatment.  Hyaline cast noted, with AKI concerning for overall presentation of dehydration.  Discussed with patient.  He is comfortable with discharge home.  Discussed importance of regular meals and staying hydrated.  Strict return precautions provided.  Patient discharged stable condition.      FINAL CLINICAL IMPRESSION(S) / ED DIAGNOSES   Final diagnoses:  Shortness of breath  Lightheadedness     Rx / DC Orders   ED Discharge Orders     None        Note:  This document was prepared using Dragon voice recognition software and may include unintentional dictation errors.   Levander Slate, MD 01/22/24 [phone]

## 2024-01-22 NOTE — Discharge Instructions (Addendum)
 You are seen in the emergency department today for evaluation of your shortness of breath and dizziness.  Your testing fortunately did not show an emergency cause for these.  I suspect that some of your symptoms may be related to dehydration.  Please make sure you are eating regular meals and staying hydrated.  Follow with your primary care doctor for further evaluation.  Return to the ER for new or worsening symptoms.

## 2024-01-23 LAB — URINE CULTURE: Culture: NO GROWTH

## 2024-03-27 ENCOUNTER — Emergency Department
Admission: EM | Admit: 2024-03-27 | Discharge: 2024-03-27 | Disposition: A | Discharge status: Home / Self Care | Attending: Emergency Medicine | Admitting: Emergency Medicine

## 2024-03-27 ENCOUNTER — Encounter: Payer: Self-pay | Admitting: *Deleted

## 2024-03-27 ENCOUNTER — Other Ambulatory Visit: Payer: Self-pay

## 2024-03-27 DIAGNOSIS — Z91199 Patient's noncompliance with other medical treatment and regimen due to unspecified reason: Secondary | ICD-10-CM | POA: Diagnosis not present

## 2024-03-27 DIAGNOSIS — R569 Unspecified convulsions: Secondary | ICD-10-CM | POA: Insufficient documentation

## 2024-03-27 LAB — CBC WITH DIFFERENTIAL/PLATELET
Abs Immature Granulocytes: 0.03 K/uL (ref 0.00–0.07)
Basophils Absolute: 0 K/uL (ref 0.0–0.1)
Basophils Relative: 0 %
Eosinophils Absolute: 0.4 K/uL (ref 0.0–0.5)
Eosinophils Relative: 4 %
HCT: 45.4 % (ref 39.0–52.0)
Hemoglobin: 14.5 g/dL (ref 13.0–17.0)
Immature Granulocytes: 0 %
Lymphocytes Relative: 29 %
Lymphs Abs: 3.1 K/uL (ref 0.7–4.0)
MCH: 25.3 pg — ABNORMAL LOW (ref 26.0–34.0)
MCHC: 31.9 g/dL (ref 30.0–36.0)
MCV: 79.1 fL — ABNORMAL LOW (ref 80.0–100.0)
Monocytes Absolute: 1 K/uL (ref 0.1–1.0)
Monocytes Relative: 9 %
Neutro Abs: 6 K/uL (ref 1.7–7.7)
Neutrophils Relative %: 58 %
Platelets: 293 K/uL (ref 150–400)
RBC: 5.74 MIL/uL (ref 4.22–5.81)
RDW: 14.3 % (ref 11.5–15.5)
WBC: 10.5 K/uL (ref 4.0–10.5)
nRBC: 0 % (ref 0.0–0.2)

## 2024-03-27 LAB — COMPREHENSIVE METABOLIC PANEL WITH GFR
ALT: 28 U/L (ref 0–44)
AST: 30 U/L (ref 15–41)
Albumin: 4.1 g/dL (ref 3.5–5.0)
Alkaline Phosphatase: 52 U/L (ref 38–126)
Anion gap: 11 (ref 5–15)
BUN: 8 mg/dL (ref 6–20)
CO2: 25 mmol/L (ref 22–32)
Calcium: 9.6 mg/dL (ref 8.9–10.3)
Chloride: 103 mmol/L (ref 98–111)
Creatinine, Ser: 1.02 mg/dL (ref 0.61–1.24)
GFR, Estimated: >60 mL/min
Glucose, Bld: 89 mg/dL (ref 70–99)
Potassium: 4 mmol/L (ref 3.5–5.1)
Sodium: 139 mmol/L (ref 135–145)
Total Bilirubin: 0.6 mg/dL (ref 0.0–1.2)
Total Protein: 6.2 g/dL — ABNORMAL LOW (ref 6.5–8.1)

## 2024-03-27 MED ORDER — LEVETIRACETAM (KEPPRA) 500 MG/5 ML ADULT IV PUSH
1500.0000 mg | INTRAVENOUS | Status: AC
  Administered 2024-03-27: 1500 mg via INTRAVENOUS
  Filled 2024-03-27: qty 15

## 2024-03-27 NOTE — ED Triage Notes (Signed)
 Pt to triage via wheelchair.  Girlfriend states pt had a seizure today and again in the er lobby.  Pt sitting up in wheelchair. Pt nonverbal at this time.  Pt not taking his keppra .  Pt alert in triage.
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
## 2024-03-27 NOTE — Discharge Instructions (Addendum)
 You have been seen in the emergency department today for a seizure.  Your workup today including labs are within normal limits.  Please follow up with your doctor/neurologist as soon as possible regarding today's emergency department visit and your likely seizure.  As we have discussed it is very important that you do not drive until you have been seen and cleared by your neurologist.  Please drink plenty of fluids, get plenty of sleep and avoid any alcohol or drug use Please return to the emergency department if you have any further seizures which do not respond to medications, or for any other symptoms per se concerning for yourself.

## 2024-04-09 ENCOUNTER — Emergency Department: Admission: EM | Admit: 2024-04-09 | Discharge: 2024-04-09 | Disposition: A | Discharge status: Home / Self Care

## 2024-04-09 ENCOUNTER — Other Ambulatory Visit: Payer: Self-pay

## 2024-04-09 DIAGNOSIS — S21212A Laceration without foreign body of left back wall of thorax without penetration into thoracic cavity, initial encounter: Secondary | ICD-10-CM

## 2024-04-09 DIAGNOSIS — S41111A Laceration without foreign body of right upper arm, initial encounter: Secondary | ICD-10-CM | POA: Diagnosis not present

## 2024-04-09 DIAGNOSIS — S31010A Laceration without foreign body of lower back and pelvis without penetration into retroperitoneum, initial encounter: Secondary | ICD-10-CM | POA: Insufficient documentation

## 2024-04-09 DIAGNOSIS — S01312A Laceration without foreign body of left ear, initial encounter: Secondary | ICD-10-CM | POA: Insufficient documentation

## 2024-04-09 DIAGNOSIS — Z23 Encounter for immunization: Secondary | ICD-10-CM | POA: Diagnosis not present

## 2024-04-09 DIAGNOSIS — S51811A Laceration without foreign body of right forearm, initial encounter: Secondary | ICD-10-CM | POA: Diagnosis not present

## 2024-04-09 DIAGNOSIS — S59911A Unspecified injury of right forearm, initial encounter: Secondary | ICD-10-CM | POA: Diagnosis present

## 2024-04-09 MED ORDER — TETANUS-DIPHTH-ACELL PERTUSSIS 5-2-15.5 LF-MCG/0.5 IM SUSP
0.5000 mL | Freq: Once | INTRAMUSCULAR | Status: AC
  Administered 2024-04-09: 0.5 mL via INTRAMUSCULAR
  Filled 2024-04-09: qty 0.5

## 2024-04-09 MED ORDER — BACITRACIN 500 UNIT/GM EX OINT: 1.0000 | TOPICAL_OINTMENT | Freq: Two times a day (BID) | CUTANEOUS | 0 refills | Status: DC

## 2024-04-09 MED ORDER — BACITRACIN 500 UNIT/GM EX OINT: 1.0000 | TOPICAL_OINTMENT | Freq: Two times a day (BID) | CUTANEOUS | 0 refills | Status: AC
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
## 2024-04-09 NOTE — Discharge Instructions (Addendum)
 Your tetanus shot was updated today.  I have sent an antibiotic ointment to the pharmacy.  Please apply this to the wounds twice a day. Watch for signs of infection including redness, warmth, swelling, pain and pus drainage.  If you develop any of these please return to the ED, urgent care or your primary care provider.
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
